# Patient Record
Sex: Female | Born: 1945 | Race: White | Hispanic: No | Smoking: Never smoker
Health system: Southern US, Community
[De-identification: ages and names within clinical notes are randomized; demographics above are authoritative.]

## PROBLEM LIST (undated history)

## (undated) DIAGNOSIS — T7840XA Allergy, unspecified, initial encounter: Secondary | ICD-10-CM

## (undated) DIAGNOSIS — F419 Anxiety disorder, unspecified: Secondary | ICD-10-CM

## (undated) DIAGNOSIS — Z9889 Other specified postprocedural states: Secondary | ICD-10-CM

## (undated) DIAGNOSIS — D649 Anemia, unspecified: Secondary | ICD-10-CM

## (undated) DIAGNOSIS — H269 Unspecified cataract: Secondary | ICD-10-CM

## (undated) DIAGNOSIS — M810 Age-related osteoporosis without current pathological fracture: Secondary | ICD-10-CM

## (undated) DIAGNOSIS — L500 Allergic urticaria: Secondary | ICD-10-CM

## (undated) DIAGNOSIS — K219 Gastro-esophageal reflux disease without esophagitis: Secondary | ICD-10-CM

## (undated) DIAGNOSIS — Z5189 Encounter for other specified aftercare: Secondary | ICD-10-CM

## (undated) DIAGNOSIS — M8580 Other specified disorders of bone density and structure, unspecified site: Secondary | ICD-10-CM

## (undated) DIAGNOSIS — R112 Nausea with vomiting, unspecified: Secondary | ICD-10-CM

## (undated) DIAGNOSIS — T8859XA Other complications of anesthesia, initial encounter: Secondary | ICD-10-CM

## (undated) DIAGNOSIS — M1990 Unspecified osteoarthritis, unspecified site: Secondary | ICD-10-CM

## (undated) DIAGNOSIS — E785 Hyperlipidemia, unspecified: Secondary | ICD-10-CM

## (undated) DIAGNOSIS — I10 Essential (primary) hypertension: Secondary | ICD-10-CM

## (undated) HISTORY — DX: Essential (primary) hypertension: I10

## (undated) HISTORY — PX: CARDIOVASCULAR STRESS TEST: SHX262

## (undated) HISTORY — DX: Unspecified osteoarthritis, unspecified site: M19.90

## (undated) HISTORY — DX: Gastro-esophageal reflux disease without esophagitis: K21.9

## (undated) HISTORY — DX: Hyperlipidemia, unspecified: E78.5

## (undated) HISTORY — DX: Other specified disorders of bone density and structure, unspecified site: M85.80

## (undated) HISTORY — DX: Anemia, unspecified: D64.9

## (undated) HISTORY — PX: TRANSTHORACIC ECHOCARDIOGRAM: SHX275

## (undated) HISTORY — DX: Anxiety disorder, unspecified: F41.9

## (undated) HISTORY — PX: CARDIAC CATHETERIZATION: SHX172

## (undated) HISTORY — DX: Allergy, unspecified, initial encounter: T78.40XA

## (undated) HISTORY — DX: Unspecified cataract: H26.9

## (undated) HISTORY — DX: Age-related osteoporosis without current pathological fracture: M81.0

## (undated) HISTORY — PX: COLONOSCOPY: SHX174

## (undated) HISTORY — PX: SINOSCOPY: SHX187

## (undated) HISTORY — DX: Encounter for other specified aftercare: Z51.89

---
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
## 2004-11-13 ENCOUNTER — Ambulatory Visit: Payer: Self-pay | Admitting: Gastroenterology

## 2004-11-25 ENCOUNTER — Ambulatory Visit: Payer: Self-pay | Admitting: Gastroenterology

## 2008-03-23 HISTORY — PX: TRANSTHORACIC ECHOCARDIOGRAM: SHX275

## 2008-12-05 ENCOUNTER — Encounter: Admission: RE | Admit: 2008-12-05 | Discharge: 2008-12-05 | Payer: Self-pay | Admitting: Cardiology

## 2008-12-11 ENCOUNTER — Ambulatory Visit (HOSPITAL_COMMUNITY): Admission: RE | Admit: 2008-12-11 | Discharge: 2008-12-11 | Payer: Self-pay | Admitting: Cardiology

## 2008-12-11 HISTORY — PX: CARDIAC CATHETERIZATION: SHX172

## 2009-10-15 ENCOUNTER — Encounter: Payer: Self-pay | Admitting: Gastroenterology

## 2010-01-15 ENCOUNTER — Encounter (INDEPENDENT_AMBULATORY_CARE_PROVIDER_SITE_OTHER): Payer: Self-pay | Admitting: *Deleted

## 2010-02-04 ENCOUNTER — Encounter (INDEPENDENT_AMBULATORY_CARE_PROVIDER_SITE_OTHER): Payer: Self-pay | Admitting: *Deleted

## 2010-02-06 ENCOUNTER — Ambulatory Visit
Admission: RE | Admit: 2010-02-06 | Discharge: 2010-02-06 | Payer: Self-pay | Source: Home / Self Care | Attending: Gastroenterology | Admitting: Gastroenterology

## 2010-02-13 NOTE — Miscellaneous (Signed)
Summary: [facility]  Clinical Lists Changes  Medications: Added new medication of MOVIPREP 100 GM  SOLR (PEG-KCL-NACL-NASULF-NA ASC-C) As per prep instructions. - Signed Rx of MOVIPREP 100 GM  SOLR (PEG-KCL-NACL-NASULF-NA ASC-C) As per prep instructions.;  #1 x 0;  Signed;  Entered by: Ezra Sites RN;  Authorized by: Louis Meckel MD;  Method used: [id number] [address] [place], [place], [place]  [address], Ph: [phone] or [phone], Fax: [phone] Allergies: Added new allergy or adverse reaction of ERYTHROMYCIN Added new allergy or adverse reaction of * CT DYE Observations: Added new observation of NKA: F (02/06/2010 8:57)    Prescriptions: MOVIPREP 100 GM  SOLR (PEG-KCL-NACL-NASULF-NA ASC-C) As per prep instructions.  #1 x 0   Entered by:   Ezra Sites RN   Authorized by:   Louis Meckel MD   Signed by:   Ezra Sites RN on 02/06/2010   Method used:   [facility]  [address] [phone][facility] (retail)       [address] [place], [place]  [address]       Ph: [phone] or [phone]       Fax: [phone]   RxID:   [id number]

## 2010-02-13 NOTE — Letter (Signed)
Summary: Pre Visit Letter Revised  [facility]  [address] [place], [place] [address]   Phone: [phone]  Fax: [phone]        01/15/2010 MRN: [id number] Karen Fox [address] [place], [place]  [address]             Procedure Date:  February 27, 2010   recall col-Dr Nedra Hai to the Gastroenterology Division at [facility]    You are scheduled to see a nurse for your pre-procedure visit on February 06, 2010 at 9:00am on the 3rd floor at [facility], [address]  We ask that you try to arrive at our office 15 minutes prior to your appointment time to allow for check-in.  Please take a minute to review the attached form.  If you answer "Yes" to one or more of the questions on the first page, we ask that you call the person listed at your earliest opportunity.  If you answer "No" to all of the questions, please complete the rest of the form and bring it to your appointment.    Your nurse visit will consist of discussing your medical and surgical history, your immediate family medical history, and your medications.   If you are unable to list all of your medications on the form, please bring the medication bottles to your appointment and we will list them.  We will need to be aware of both prescribed and over the counter drugs.  We will need to know exact dosage information as well.    Please be prepared to read and sign documents such as consent forms, a financial agreement, and acknowledgement forms.  If necessary, and with your consent, a friend or relative is welcome to sit-in on the nurse visit with you.  Please bring your insurance card so that we may make a copy of it.  If your insurance requires a referral to see a specialist, please bring your referral form from your primary care physician.  No co-pay is required for this nurse visit.     If you cannot keep your appointment, please call [phone] to cancel or reschedule prior  to your appointment date.  This allows [place] the opportunity to schedule an appointment for another patient in need of care.    Thank you for choosing  Gastroenterology for your medical needs.  We appreciate the opportunity to care for you.  Please visit [place] at our website  to learn more about our practice.  Sincerely, The Gastroenterology Division

## 2010-02-13 NOTE — Letter (Signed)
Summary: [facility] Instructions  [facility]  [address] [place], [place] [address]   Phone: [phone]  Fax: [phone]       Karen Fox    April 01, 1945    MRN: [id number]        Procedure Day /Date:  Thursday 03/06/2010     Arrival Time: 8:00 am     Procedure Time: 9:00 am     Location of Procedure:                    _x _  [facility] (4th Floor)                        PREPARATION FOR COLONOSCOPY WITH MOVIPREP   Starting 5 days prior to your procedure Saturday 2/18 do not eat nuts, seeds, popcorn, corn, beans, peas,  salads, or any raw vegetables.  Do not take any fiber supplements (e.g. Metamucil, Citrucel, and Benefiber).  THE DAY BEFORE YOUR PROCEDURE         DATE: Wednesday 2/22  1.  Drink clear liquids the entire day-NO SOLID FOOD  2.  Do not drink anything colored red or purple.  Avoid juices with pulp.  No orange juice.  3.  Drink at least 64 oz. (8 glasses) of fluid/clear liquids during the day to prevent dehydration and help the prep work efficiently.  CLEAR LIQUIDS INCLUDE: Water Jello Ice Popsicles Tea (sugar ok, no milk/cream) Powdered fruit flavored drinks Coffee (sugar ok, no milk/cream) Gatorade Juice: apple, white grape, white cranberry  Lemonade Clear bullion, consomm, broth Carbonated beverages (any kind) Strained chicken noodle soup Hard Candy                             4.  In the morning, mix first dose of MoviPrep solution:    Empty 1 Pouch A and 1 Pouch B into the disposable container    Add lukewarm drinking water to the top line of the container. Mix to dissolve    Refrigerate (mixed solution should be used within 24 hrs)  5.  Begin drinking the prep at 5:00 p.m. The MoviPrep container is divided by 4 marks.   Every 15 minutes drink the solution down to the next mark (approximately 8 oz) until the full liter is complete.   6.  Follow completed prep with 16 oz of clear liquid of your choice  (Nothing red or purple).  Continue to drink clear liquids until bedtime.  7.  Before going to bed, mix second dose of MoviPrep solution:    Empty 1 Pouch A and 1 Pouch B into the disposable container    Add lukewarm drinking water to the top line of the container. Mix to dissolve    Refrigerate  THE DAY OF YOUR PROCEDURE      DATE: Thursday 2/23  Beginning at 4:00 a.m. (5 hours before procedure):         1. Every 15 minutes, drink the solution down to the next mark (approx 8 oz) until the full liter is complete.  2. Follow completed prep with 16 oz. of clear liquid of your choice.    3. You may drink clear liquids until 7:00 am (2 HOURS BEFORE PROCEDURE).   MEDICATION INSTRUCTIONS  Unless otherwise instructed, you should take regular prescription medications with a small sip of water   as early as possible the morning of your  procedure.          OTHER INSTRUCTIONS  You will need a responsible adult at least 64 years of age to accompany you and drive you home.   This person must remain in the waiting room during your procedure.  Wear loose fitting clothing that is easily removed.  Leave jewelry and other valuables at home.  However, you may wish to bring a book to read or  an iPod/MP3 player to listen to music as you wait for your procedure to start.  Remove all body piercing jewelry and leave at home.  Total time from sign-in until discharge is approximately 2-3 hours.  You should go home directly after your procedure and rest.  You can resume normal activities the  day after your procedure.  The day of your procedure you should not:   Drive   Make legal decisions   Operate machinery   Drink alcohol   Return to work  You will receive specific instructions about eating, activities and medications before you leave.    The above instructions have been reviewed and explained to me by   Ezra Sites RN  February 06, 2010 9:25 AM     I fully understand and  can verbalize these instructions _____________________________ Date _________

## 2010-02-13 NOTE — Letter (Signed)
Summary: Colonoscopy Letter  [facility]  [address] [place], [place] [address]   Phone: [phone]  Fax: [phone]      October 15, 2009 MRN: [id number]   [facility] [address] [place], [place]  [address]   Dear Ms. [facility],   According to your medical record, it is time for you to schedule a Colonoscopy. The American Cancer Society recommends this procedure as a method to detect early colon cancer. Patients with a family history of colon cancer, or a personal history of colon polyps or inflammatory bowel disease are at increased risk.  This letter has been generated based on the recommendations made at the time of your procedure. If you feel that in your particular situation this may no longer apply, please contact our office.  Please call our office at [phone] to schedule this appointment or to update your records at your earliest convenience.  Thank you for cooperating with [place] to provide you with the very best care possible.   Sincerely,  Barbette Hair. Arlyce Dice, M.D.  [facility] Gastroenterology Division [phone]

## 2010-03-06 ENCOUNTER — Other Ambulatory Visit (AMBULATORY_SURGERY_CENTER): Payer: PRIVATE HEALTH INSURANCE | Admitting: Gastroenterology

## 2010-03-06 ENCOUNTER — Encounter: Payer: Self-pay | Admitting: Gastroenterology

## 2010-03-06 DIAGNOSIS — K5730 Diverticulosis of large intestine without perforation or abscess without bleeding: Secondary | ICD-10-CM

## 2010-03-06 DIAGNOSIS — Z1211 Encounter for screening for malignant neoplasm of colon: Secondary | ICD-10-CM

## 2010-03-06 DIAGNOSIS — Z800 Family history of malignant neoplasm of digestive organs: Secondary | ICD-10-CM

## 2010-03-11 NOTE — Procedures (Signed)
Summary: Colonoscopy  Patient: Karen Fox Note: All result statuses are Final unless otherwise noted.  Tests: (1) Colonoscopy (COL)   COL Colonoscopy           DONE     [facility]     [address]     [place], [place]  [address]           COLONOSCOPY PROCEDURE REPORT           PATIENT:  Karen Fox, Karen Fox  MR#:  [id number]     BIRTHDATE:  23-May-1945, 64 yrs. old  GENDER:  female           ENDOSCOPIST:  Barbette Hair. Arlyce Dice, MD     Referred by:  Marjory Lies, M.D.           PROCEDURE DATE:  03/06/2010     PROCEDURE:  Diagnostic Colonoscopy     ASA CLASS:  Class II     INDICATIONS:  1) Elevated Risk Screening  2) family history of     colon cancer Father in 42's           MEDICATIONS:   Fentanyl 75 mcg IV, Versed 7 mg IV           DESCRIPTION OF PROCEDURE:   After the risks benefits and     alternatives of the procedure were thoroughly explained, informed     consent was obtained.  Digital rectal exam was performed and     revealed no abnormalities.   The [id number] endoscope was     introduced through the anus and advanced to the cecum, which was     identified by both the appendix and ileocecal valve, without     limitations.  The quality of the prep was excellent, using     MoviPrep.  The instrument was then slowly withdrawn as the colon     was fully examined.     <<PROCEDUREIMAGES>>           FINDINGS:  Moderate diverticulosis was found in the sigmoid colon     (see image1).  Scattered diverticula were found (see image6).     descending to ascending colon  This was otherwise a normal     examination of the colon (see image2, image3, image4, image7,     image9, and image10).   Retroflexed views in the rectum revealed     no abnormalities.    The time to cecum =  5.0  minutes. The scope     was then withdrawn (time =  6.0  min) from the patient and the     procedure completed.           COMPLICATIONS:  None           ENDOSCOPIC IMPRESSION:     1)  Moderate diverticulosis in the sigmoid colon     2) Diverticula, scattered     3) Otherwise normal examination     RECOMMENDATIONS:     1) Given your significant family history of colon cancer, you     should have a repeat colonoscopy in 5 years           REPEAT EXAM:   5 year(s) Colonoscopy           ______________________________     Barbette Hair. Arlyce Dice, MD           CC:           n.     eSIGNED:  Barbette Hair. Kaplan at 03/06/2010 09:43 AM           [place], [id number]  Note: An exclamation mark (!) indicates a result that was not dispersed into the flowsheet. Document Creation Date: 03/06/2010 9:44 AM _______________________________________________________________________  (1) Order result status: Final Collection or observation date-time: 03/06/2010 09:39 Requested date-time:  Receipt date-time:  Reported date-time:  Referring Physician:   Ordering Physician: Melvia Heaps [id number]) Specimen Source:  Source: Launa Grill Order Number: [id number] Lab site:   Appended Document: Colonoscopy    Clinical Lists Changes  Observations: Added new observation of COLONNXTDUE: 02/2015 (03/06/2010 16:20)

## 2011-12-31 ENCOUNTER — Other Ambulatory Visit (HOSPITAL_COMMUNITY): Payer: Self-pay | Admitting: Cardiovascular Disease

## 2011-12-31 DIAGNOSIS — R079 Chest pain, unspecified: Secondary | ICD-10-CM

## 2011-12-31 DIAGNOSIS — R0602 Shortness of breath: Secondary | ICD-10-CM

## 2012-01-12 ENCOUNTER — Encounter (HOSPITAL_COMMUNITY): Payer: PRIVATE HEALTH INSURANCE

## 2012-01-26 ENCOUNTER — Ambulatory Visit (HOSPITAL_COMMUNITY)
Admission: RE | Admit: 2012-01-26 | Discharge: 2012-01-26 | Disposition: A | Discharge status: Home / Self Care | Payer: Medicare Other | Source: Ambulatory Visit | Attending: Cardiovascular Disease | Admitting: Cardiovascular Disease

## 2012-01-26 DIAGNOSIS — R079 Chest pain, unspecified: Secondary | ICD-10-CM

## 2012-01-26 DIAGNOSIS — R0609 Other forms of dyspnea: Secondary | ICD-10-CM | POA: Insufficient documentation

## 2012-01-26 DIAGNOSIS — I2510 Atherosclerotic heart disease of native coronary artery without angina pectoris: Secondary | ICD-10-CM | POA: Insufficient documentation

## 2012-01-26 DIAGNOSIS — R0989 Other specified symptoms and signs involving the circulatory and respiratory systems: Secondary | ICD-10-CM | POA: Insufficient documentation

## 2012-01-26 DIAGNOSIS — R0602 Shortness of breath: Secondary | ICD-10-CM

## 2012-01-26 DIAGNOSIS — I10 Essential (primary) hypertension: Secondary | ICD-10-CM | POA: Insufficient documentation

## 2012-01-26 HISTORY — PX: CARDIOVASCULAR STRESS TEST: SHX262

## 2012-01-26 MED ORDER — TECHNETIUM TC 99M SESTAMIBI GENERIC - CARDIOLITE
10.1000 | Freq: Once | INTRAVENOUS | Status: AC | PRN
  Administered 2012-01-26: 10.1 via INTRAVENOUS

## 2012-01-26 MED ORDER — TECHNETIUM TC 99M SESTAMIBI GENERIC - CARDIOLITE
30.6000 | Freq: Once | INTRAVENOUS | Status: AC | PRN
  Administered 2012-01-26: 31 via INTRAVENOUS

## 2012-01-26 NOTE — Procedures (Addendum)
[facility] [address] [place] [address] [phone]  Cardiology Nuclear Med Study  Karen Fox is a 67 y.o. female     MRN : [id number]     DOB: [birth year]  Procedure Date: 01/26/2012  Nuclear Med Background Indication for Stress Test:  Evaluation for Ischemia History:  non critical CAD Cardiac Risk Factors: Family History - CAD, Hypertension and Lipids  Symptoms:  Chest Pain and DOE   Nuclear Pre-Procedure Caffeine/Decaff Intake:  7:00pm NPO After: 5:00am   IV Site: R Antecubital  IV 0.9% NS with Angio Cath:  22g  Chest Size (in):  n/a IV Started by: Koren Shiver, CNMT  Height: 5\' 5"  (1.651 m)  Cup Size: C  BMI:  Body mass index is 25.79 kg/(m^2). Weight:  155 lb (70.308 kg)   Tech Comments:  n/a    Nuclear Med Study 1 or 2 day study: 1 day  Stress Test Type:  Stress  Order Authorizing Provider:  Nanetta Batty, MD   Resting Radionuclide: Technetium 42m Sestamibi  Resting Radionuclide Dose: 10.1 mCi   Stress Radionuclide:  Technetium 43m Sestamibi  Stress Radionuclide Dose: 30.6 mCi           Stress Protocol Rest HR: 82 Stress HR: 141  Rest BP: 134/83 Stress BP: 188/69  Exercise Time (min): 9 METS: 10.40          Dose of Adenosine (mg):  n/a Dose of Lexiscan: n/a mg  Dose of Atropine (mg): n/a Dose of Dobutamine: n/a mcg/kg/min (at max HR)  Stress Test Technologist: Ernestene Mention, CCT Nuclear Technologist: Gonzella Lex, CNMT   Rest Procedure:  Myocardial perfusion imaging was performed at rest 45 minutes following the intravenous administration of Technetium 59m Sestamibi. Stress Procedure:  The patient performed treadmill exercise using a Bruce  Protocol for 9 minutes. The patient stopped due to shortness of breath, fatigue and some chest discomfort. Chest discomfort did resolve during the first two minutes of recovery.  There were no significant ST-T wave changes.  Technetium 43m Sestamibi was  injected at peak exercise and myocardial perfusion imaging was performed after a brief delay.  Transient Ischemic Dilatation (Normal <1.22):  0.95 Lung/Heart Ratio (Normal <0.45):  0.25 QGS EDV:  44 ml QGS ESV:  10 ml LV Ejection Fraction: 78%     Rest ECG: NSR - Normal EKG  Stress ECG: No significant change from baseline ECG  QPS Raw Data Images:  Normal; no motion artifact; normal heart/lung ratio. Stress Images:  There is a small area of mild reduction in tracer uptake in the anteroapical distribution. Rest Images:  There is partial resolution of the anteroapical defect. Subtraction (SDS):  There is a partially revesible small and mild anteroapical defect. LV Wall Motion:  NL LV Function; NL Wall Motion  Impression Exercise Capacity:  Good exercise capacity. BP Response:  Normal blood pressure response. Clinical Symptoms:  Typical chest pain. ECG Impression:  No significant ST segment change suggestive of ischemia. Comparison with Prior Nuclear Study: Direct comparison with the previous year's study shows a slightly more prominent anteroapical abnormality on the current study. The previous study was interpreted as normal.  Overall Impression:  Low risk stress nuclear study. There is a small area of mild ischemia in the distal LAD artery distribution.     Thurmon Fair, MD  01/26/2012 12:26 PM

## 2012-05-13 ENCOUNTER — Encounter: Payer: Self-pay | Admitting: Cardiovascular Disease

## 2013-04-12 ENCOUNTER — Ambulatory Visit: Payer: PRIVATE HEALTH INSURANCE | Admitting: Cardiovascular Disease

## 2013-04-14 ENCOUNTER — Encounter: Payer: Self-pay | Admitting: Cardiovascular Disease

## 2013-04-14 ENCOUNTER — Ambulatory Visit (INDEPENDENT_AMBULATORY_CARE_PROVIDER_SITE_OTHER): Payer: Medicare Other | Admitting: Cardiovascular Disease

## 2013-04-14 VITALS — BP 136/82 | HR 79 | Ht 65.25 in | Wt 154.8 lb

## 2013-04-14 DIAGNOSIS — E785 Hyperlipidemia, unspecified: Secondary | ICD-10-CM

## 2013-04-14 DIAGNOSIS — I10 Essential (primary) hypertension: Secondary | ICD-10-CM

## 2013-04-14 NOTE — Progress Notes (Signed)
04/14/2013 [place]   05-17-1945  [id number]  Primary Physician Stephens Shire, MD Primary Cardiologist: Lorretta Harp MD Renae Gloss   HPI:  Mr. Dardis returns today for one-year followup. I saw her after a Myoview stress test showed subtle inferior ischemia, done because of chest pain which has since resolved. Her risk factors include treated hyperlipidemia and family history. Her most recent LDL was 100 with mild hypertriglyceridemia. She does complain of reflux-type symptoms as well, which have improved over time. Her most recent lipid profile performed by her primary care physician in October of last year for a glucose of 213, LDL 127, HDL of 46 and triglyceride level of 202.    Current Outpatient Prescriptions  Medication Sig Dispense Refill  . ALPRAZolam (XANAX) 0.5 MG tablet Take 0.5 mg by mouth at bedtime as needed for anxiety.      Marland Kitchen aspirin EC 81 MG tablet Take 81 mg by mouth daily.      . Butalbital-APAP-Caffeine (ZEBUTAL) 50-325-40 MG per capsule Take 1 capsule by mouth every 4 (four) hours as needed for pain.      . Cholecalciferol (VITAMIN D3) 2000 UNITS capsule Take 2,000 Units by mouth daily.      . citalopram (CELEXA) 20 MG tablet Take 1 tablet by mouth daily.      . fenofibrate 160 MG tablet Take 160 mg by mouth daily.      . fexofenadine (ALLEGRA) 180 MG tablet Take 180 mg by mouth daily.      . fluticasone (FLONASE) 50 MCG/ACT nasal spray Place 1-2 sprays into both nostrils daily.      . fosinopril (MONOPRIL) 10 MG tablet Take 10 mg by mouth daily.      . Omega 3 1000 MG CAPS Take 1 capsule by mouth daily.      . polycarbophil (FIBERCON) 625 MG tablet Take 312.5 mg by mouth daily.      . vitamin E 400 UNIT capsule Take 400 Units by mouth daily.       No current facility-administered medications for this visit.    Allergies  Allergen Reactions  . Contrast Media [Iodinated Diagnostic Agents] Hives  . Erythromycin     REACTION: rash     History   Social History  . Marital Status: Married    Spouse Name: N/A    Number of Children: N/A  . Years of Education: N/A   Occupational History  . Not on file.   Social History Main Topics  . Smoking status: Never Smoker   . Smokeless tobacco: Never Used  . Alcohol Use: No  . Drug Use: No  . Sexual Activity: Not on file   Other Topics Concern  . Not on file   Social History Narrative  . No narrative on file     Review of Systems: General: negative for chills, fever, night sweats or weight changes.  Cardiovascular: negative for chest pain, dyspnea on exertion, edema, orthopnea, palpitations, paroxysmal nocturnal dyspnea or shortness of breath Dermatological: negative for rash Respiratory: negative for cough or wheezing Urologic: negative for hematuria Abdominal: negative for nausea, vomiting, diarrhea, bright red blood per rectum, melena, or hematemesis Neurologic: negative for visual changes, syncope, or dizziness All other systems reviewed and are otherwise negative except as noted above.    Blood pressure 136/82, pulse 79, height 5' 5.25" (1.657 m), weight 154 lb 12.8 oz (70.217 kg).  General appearance: alert and no distress Neck: no adenopathy, no carotid bruit, no  JVD, supple, symmetrical, trachea midline and thyroid not enlarged, symmetric, no tenderness/mass/nodules Lungs: clear to auscultation bilaterally Heart: regular rate and rhythm, S1, S2 normal, no murmur, click, rub or gallop Extremities: extremities normal, atraumatic, no cyanosis or edema  EKG normal sinus rhythm at 79 without ST or T wave changes  ASSESSMENT AND PLAN:   Essential hypertension Under good control on current medications  Hyperlipidemia On fenofibrate with recent lipid profile performed by her primary care physician on 11/08/12 with a total cholesterol of 213, LDL 127, HDL 46 and triglyceride level of 202. She does admit to dietary indiscretion.      Lorretta Harp  MD FACP,FACC,FAHA, [facility] 04/14/2013 11:00 AM

## 2013-04-14 NOTE — Patient Instructions (Signed)
Your physician recommends that you schedule a follow-up appointment in: ONE YEAR with Dr. Berry.  

## 2013-04-14 NOTE — Assessment & Plan Note (Addendum)
On fenofibrate with recent lipid profile performed by her primary care physician on 11/08/12 with a total cholesterol of 213, LDL 127, HDL 46 and triglyceride level of 202. She does admit to dietary indiscretion.

## 2013-04-14 NOTE — Assessment & Plan Note (Signed)
Under good control on current medications 

## 2015-03-08 ENCOUNTER — Encounter: Payer: Self-pay | Admitting: Gastroenterology

## 2015-05-07 ENCOUNTER — Encounter: Payer: Self-pay | Admitting: Gastroenterology

## 2015-06-06 ENCOUNTER — Ambulatory Visit (AMBULATORY_SURGERY_CENTER): Payer: Self-pay | Admitting: *Deleted

## 2015-06-06 ENCOUNTER — Telehealth: Payer: Self-pay | Admitting: *Deleted

## 2015-06-06 VITALS — Ht 65.0 in | Wt 156.0 lb

## 2015-06-06 DIAGNOSIS — Z800 Family history of malignant neoplasm of digestive organs: Secondary | ICD-10-CM

## 2015-06-06 MED ORDER — NA SULFATE-K SULFATE-MG SULF 17.5-3.13-1.6 GM/177ML PO SOLN: 1.0000 | Freq: Once | ORAL | Status: DC

## 2015-06-06 NOTE — Telephone Encounter (Signed)
Ok to schedule EGD. Thanks

## 2015-06-06 NOTE — Telephone Encounter (Signed)
Dr Silverio Decamp, This pt is a previous pt of Deatra Ina, she has [facility] in [place], last colon 03-06-10 with tics only. In [place] today she is asking to add an EGD to her colon that is scheduled 06-27-15. She has had some mid sternum chest pain , she has seen cardiology and has had work ups x 2 that were both normal , had cardiac cath as well that was normal.  She has some reflux but not severe, she has pain mid sternum, when she walks to exercise .  She has this complaint for 2-3 years and she states she will have pain occasionally for days and then it goes away and comes back.  She has tried Nexium and ranitidine with no help. She has not seen GI for this complaint and is asking about adding the egd to see if its reflux or HH or something.  You have no OV open to schedule her with you so wanted to ask about adding.  She denies dysphagia but states she will occasionally get strangled with both liquids and solids but seems a bit better recently. Please advise  thanks for your time, [name]

## 2015-06-06 NOTE — Telephone Encounter (Signed)
Changed pt to a 2 pm double 06-27-2015 per pt request to keep same day. Instructed pt on time changes for new time the same day over the phone and she changed on her prep instructions.   She was instructed on the light breakfast by 9 am 6-14 and then just cleras, 1st suprep 6 pm Wednesday and then 2nd suprep 9 am 6-15 and no liquids after 11 am, meds all in by 11am. Instructed pt to call with questions Lelan Pons PV

## 2015-06-06 NOTE — Progress Notes (Signed)
No egg or soy allergy known to patient  No issues with past sedation with any surgeries  or procedures, no intubation problems  No diet pills per patient No home 02 use per patient  No blood thinners per patient  Pt denies issues with constipation  Te to Nandigam -pt wishes to add egd to colon. Lelan Pons

## 2015-06-27 ENCOUNTER — Encounter: Payer: Medicare Other | Admitting: Gastroenterology

## 2015-06-27 ENCOUNTER — Encounter: Payer: Self-pay | Admitting: Gastroenterology

## 2015-06-27 ENCOUNTER — Ambulatory Visit (AMBULATORY_SURGERY_CENTER): Payer: Medicare Other | Admitting: Gastroenterology

## 2015-06-27 VITALS — BP 136/64 | HR 66 | Temp 98.7°F | Resp 10 | Ht 65.0 in | Wt 156.0 lb

## 2015-06-27 DIAGNOSIS — K219 Gastro-esophageal reflux disease without esophagitis: Secondary | ICD-10-CM | POA: Diagnosis not present

## 2015-06-27 DIAGNOSIS — R079 Chest pain, unspecified: Secondary | ICD-10-CM

## 2015-06-27 DIAGNOSIS — Z800 Family history of malignant neoplasm of digestive organs: Secondary | ICD-10-CM | POA: Diagnosis not present

## 2015-06-27 DIAGNOSIS — D122 Benign neoplasm of ascending colon: Secondary | ICD-10-CM

## 2015-06-27 DIAGNOSIS — Z1211 Encounter for screening for malignant neoplasm of colon: Secondary | ICD-10-CM

## 2015-06-27 DIAGNOSIS — K635 Polyp of colon: Secondary | ICD-10-CM | POA: Diagnosis not present

## 2015-06-27 DIAGNOSIS — R1310 Dysphagia, unspecified: Secondary | ICD-10-CM | POA: Diagnosis not present

## 2015-06-27 MED ORDER — SODIUM CHLORIDE 0.9 % IV SOLN: 500.0000 mL | INTRAVENOUS | Status: DC

## 2015-06-27 NOTE — Patient Instructions (Signed)
Impression/recommendations:  Endoscopy: Normal   Colonoscopy: Polyp (handout given) Diverticulosis (handout given) High Fiber Diet (handout given) Hemorrhoids (handout given)  YOU HAD AN ENDOSCOPIC PROCEDURE TODAY AT [facility]:   Refer to the procedure report that was given to you for any specific questions about what was found during the examination.  If the procedure report does not answer your questions, please call your gastroenterologist to clarify.  If you requested that your care partner not be given the details of your procedure findings, then the procedure report has been included in a sealed envelope for you to review at your convenience later.  YOU SHOULD EXPECT: Some feelings of bloating in the abdomen. Passage of more gas than usual.  Walking can help get rid of the air that was put into your GI tract during the procedure and reduce the bloating. If you had a lower endoscopy (such as a colonoscopy or flexible sigmoidoscopy) you may notice spotting of blood in your stool or on the toilet paper. If you underwent a bowel prep for your procedure, you may not have a normal bowel movement for a few days.  Please Note:  You might notice some irritation and congestion in your nose or some drainage.  This is from the oxygen used during your procedure.  There is no need for concern and it should clear up in a day or so.  SYMPTOMS TO REPORT IMMEDIATELY:   Following lower endoscopy (colonoscopy or flexible sigmoidoscopy):  Excessive amounts of blood in the stool  Significant tenderness or worsening of abdominal pains  Swelling of the abdomen that is new, acute  Fever of 100F or higher   Following upper endoscopy (EGD)  Vomiting of blood or coffee ground material  New chest pain or pain under the shoulder blades  Painful or persistently difficult swallowing  New shortness of breath  Fever of 100F or higher  Black, tarry-looking stools  For urgent or emergent  issues, a gastroenterologist can be reached at any hour by calling [phone].   DIET: Your first meal following the procedure should be a small meal and then it is ok to progress to your normal diet. Heavy or fried foods are harder to digest and may make you feel nauseous or bloated.  Likewise, meals heavy in dairy and vegetables can increase bloating.  Drink plenty of fluids but you should avoid alcoholic beverages for 24 hours.  ACTIVITY:  You should plan to take it easy for the rest of today and you should NOT DRIVE or use heavy machinery until tomorrow (because of the sedation medicines used during the test).    FOLLOW UP: Our staff will call the number listed on your records the next business day following your procedure to check on you and address any questions or concerns that you may have regarding the information given to you following your procedure. If we do not reach you, we will leave a message.  However, if you are feeling well and you are not experiencing any problems, there is no need to return our call.  We will assume that you have returned to your regular daily activities without incident.  If any biopsies were taken you will be contacted by phone or by letter within the next 1-3 weeks.  Please call us at [phone] if you have not heard about the biopsies in 3 weeks.    SIGNATURES/CONFIDENTIALITY: You and/or your care partner have signed paperwork which will be entered into your electronic  medical record.  These signatures attest to the fact that that the information above on your After Visit Summary has been reviewed and is understood.  Full responsibility of the confidentiality of this discharge information lies with you and/or your care-partner.

## 2015-06-27 NOTE — Op Note (Signed)
[facility] Patient Name: Karen Fox Procedure Date: 06/27/2015 1:53 PM MRN: [id number] Endoscopist: Mauri Pole , MD Age: 70 Referring MD:  Date of Birth: March 06, 1945 Gender: Female Procedure:                Colonoscopy Indications:              Screening in patient at increased risk: Family                            history of 1st-degree relative with colorectal                            cancer Medicines:                Monitored Anesthesia Care Procedure:                Pre-Anesthesia Assessment:                           - Prior to the procedure, a History and Physical                            was performed, and patient medications and                            allergies were reviewed. The patient's tolerance of                            previous anesthesia was also reviewed. The risks                            and benefits of the procedure and the sedation                            options and risks were discussed with the patient.                            All questions were answered, and informed consent                            was obtained. Prior Anticoagulants: The patient has                            taken no previous anticoagulant or antiplatelet                            agents. ASA Grade Assessment: II - A patient with                            mild systemic disease. After reviewing the risks                            and benefits, the patient was deemed in  satisfactory condition to undergo the procedure.                           After obtaining informed consent, the colonoscope                            was passed under direct vision. Throughout the                            procedure, the patient's blood pressure, pulse, and                            oxygen saturations were monitored continuously. The                            Model CF-HQ190L [id number]) scope was introduced     through the anus and advanced to the the cecum,                            identified by appendiceal orifice and ileocecal                            valve. The colonoscopy was performed without                            difficulty. The patient tolerated the procedure                            well. The quality of the bowel preparation was                            good. The terminal ileum, ileocecal valve,                            appendiceal orifice, and rectum were photographed. Scope In: 2:12:09 PM Scope Out: 2:27:25 PM Scope Withdrawal Time: 0 hours 8 minutes 44 seconds  Total Procedure Duration: 0 hours 15 minutes 16 seconds  Findings:                 The perianal and digital rectal examinations were                            normal.                           A 12 mm polyp was found in the ascending colon. The                            polyp was sessile. The polyp was removed with a hot                            snare. Resection and retrieval were complete.                           Multiple small  and large-mouthed diverticula were                            found in the sigmoid colon and descending colon.                           Non-bleeding internal hemorrhoids were found during                            retroflexion. The hemorrhoids were medium-sized. Complications:            No immediate complications. Estimated Blood Loss:     Estimated blood loss was minimal. Impression:               - One 12 mm polyp in the ascending colon, removed                            with a hot snare. Resected and retrieved.                           - Diverticulosis in the sigmoid colon and in the                            descending colon.                           - Non-bleeding internal hemorrhoids. Recommendation:           - Patient has a contact number available for                            emergencies. The signs and symptoms of potential                            delayed  complications were discussed with the                            patient. Return to normal activities tomorrow.                            Written discharge instructions were provided to the                            patient.                           - Resume previous diet.                           - Continue present medications.                           - Await pathology results.                           - Repeat colonoscopy in 3 - 5 years for  surveillance.                           - Return to GI clinic PRN. Mauri Pole, MD 06/27/2015 2:35:15 PM This report has been signed electronically.

## 2015-06-27 NOTE — Progress Notes (Signed)
Called to room to assist during endoscopic procedure.  Patient ID and intended procedure confirmed with present staff. Received instructions for my participation in the procedure from the performing physician.  

## 2015-06-27 NOTE — Op Note (Signed)
[facility] Patient Name: Karen Fox Procedure Date: 06/27/2015 1:56 PM MRN: [id number] Endoscopist: Mauri Pole , MD Age: 70 Referring MD:  Date of Birth: 04/10/1945 Gender: Female Procedure:                Upper GI endoscopy Indications:              Dysphagia, Esophageal reflux symptoms that persist                            despite appropriate therapy Medicines:                Monitored Anesthesia Care Procedure:                Pre-Anesthesia Assessment:                           - Prior to the procedure, a History and Physical                            was performed, and patient medications and                            allergies were reviewed. The patient's tolerance of                            previous anesthesia was also reviewed. The risks                            and benefits of the procedure and the sedation                            options and risks were discussed with the patient.                            All questions were answered, and informed consent                            was obtained. Prior Anticoagulants: The patient has                            taken no previous anticoagulant or antiplatelet                            agents. ASA Grade Assessment: II - A patient with                            mild systemic disease. After reviewing the risks                            and benefits, the patient was deemed in                            satisfactory condition to undergo the procedure.  After obtaining informed consent, the endoscope was                            passed under direct vision. Throughout the                            procedure, the patient's blood pressure, pulse, and                            oxygen saturations were monitored continuously. The                            Model GIF-HQ190 [id number]) scope was introduced                            through the mouth, and advanced to the  second part                            of duodenum. The upper GI endoscopy was                            accomplished without difficulty. The patient                            tolerated the procedure well. Scope In: Scope Out: Findings:                 The examined esophagus was normal. Biopsies were                            obtained from the proximal and distal esophagus                            with cold forceps for histology of suspected                            eosinophilic esophagitis.                           The stomach was normal.                           The examined duodenum was normal. Complications:            No immediate complications. Estimated Blood Loss:     Estimated blood loss was minimal. Impression:               - Normal esophagus. Biopsied.                           - Normal stomach.                           - Normal examined duodenum. Recommendation:           - Patient has a contact number available for  emergencies. The signs and symptoms of potential                            delayed complications were discussed with the                            patient. Return to normal activities tomorrow.                            Written discharge instructions were provided to the                            patient.                           - Resume previous diet.                           - Continue present medications.                           - Await pathology results.                           - No repeat upper endoscopy. Mauri Pole, MD 06/27/2015 2:33:12 PM This report has been signed electronically.

## 2015-06-27 NOTE — Progress Notes (Signed)
Report to PACU, RN, vss, BBS= Clear.  

## 2015-06-28 ENCOUNTER — Telehealth: Payer: Self-pay

## 2015-06-28 NOTE — Telephone Encounter (Signed)
  Follow up Call-  Call back number 06/27/2015  Post procedure Call Back phone  # [phone]  Permission to leave phone message Yes     Patient questions:  Do you have a fever, pain , or abdominal swelling? No. Pain Score  0 *  Have you tolerated food without any problems? Yes.    Have you been able to return to your normal activities? Yes.    Do you have any questions about your discharge instructions: Diet   No. Medications  No. Follow up visit  No.  Do you have questions or concerns about your Care? No.  Actions: * If pain score is 4 or above: No action needed, pain <4.

## 2015-07-03 ENCOUNTER — Encounter: Payer: Self-pay | Admitting: Gastroenterology

## 2016-11-09 ENCOUNTER — Other Ambulatory Visit: Payer: Self-pay | Admitting: Physician Assistant

## 2016-11-09 DIAGNOSIS — R1013 Epigastric pain: Secondary | ICD-10-CM

## 2016-11-09 DIAGNOSIS — R110 Nausea: Secondary | ICD-10-CM

## 2016-11-10 ENCOUNTER — Ambulatory Visit
Admission: RE | Admit: 2016-11-10 | Discharge: 2016-11-10 | Disposition: A | Discharge status: Home / Self Care | Payer: Medicare Other | Source: Ambulatory Visit | Attending: Physician Assistant | Admitting: Physician Assistant

## 2016-11-10 DIAGNOSIS — R1013 Epigastric pain: Secondary | ICD-10-CM

## 2016-11-10 DIAGNOSIS — R110 Nausea: Secondary | ICD-10-CM

## 2017-10-26 ENCOUNTER — Ambulatory Visit: Payer: Self-pay | Admitting: Allergy and Immunology

## 2017-11-01 ENCOUNTER — Ambulatory Visit: Payer: Self-pay | Admitting: Allergy and Immunology

## 2017-12-06 ENCOUNTER — Other Ambulatory Visit: Payer: Self-pay | Admitting: Otolaryngology

## 2017-12-06 DIAGNOSIS — R42 Dizziness and giddiness: Secondary | ICD-10-CM

## 2017-12-22 ENCOUNTER — Other Ambulatory Visit: Payer: Medicare Other

## 2017-12-22 ENCOUNTER — Ambulatory Visit
Admission: RE | Admit: 2017-12-22 | Discharge: 2017-12-22 | Disposition: A | Discharge status: Home / Self Care | Payer: Medicare Other | Source: Ambulatory Visit | Attending: Otolaryngology | Admitting: Otolaryngology

## 2017-12-22 DIAGNOSIS — R42 Dizziness and giddiness: Secondary | ICD-10-CM

## 2018-01-12 HISTORY — PX: COLONOSCOPY: SHX174

## 2018-07-01 ENCOUNTER — Encounter: Payer: Self-pay | Admitting: Gastroenterology

## 2018-07-06 ENCOUNTER — Encounter: Payer: Self-pay | Admitting: *Deleted

## 2018-07-18 ENCOUNTER — Other Ambulatory Visit: Payer: Self-pay

## 2018-07-18 ENCOUNTER — Ambulatory Visit: Payer: Medicare Other

## 2018-07-18 VITALS — Ht 65.0 in | Wt 162.0 lb

## 2018-07-18 DIAGNOSIS — Z1211 Encounter for screening for malignant neoplasm of colon: Secondary | ICD-10-CM

## 2018-07-18 MED ORDER — NA SULFATE-K SULFATE-MG SULF 17.5-3.13-1.6 GM/177ML PO SOLN: 1.0000 | Freq: Once | ORAL | 0 refills | Status: AC

## 2018-07-18 MED ORDER — NA SULFATE-K SULFATE-MG SULF 17.5-3.13-1.6 GM/177ML PO SOLN: 1.0000 | Freq: Once | ORAL | 0 refills | Status: DC

## 2018-07-18 NOTE — Progress Notes (Signed)
No egg or soy allergy known to patient  No issues with past sedation with any surgeries  or procedures, no intubation problems  No diet pills per patient No home 02 use per patient  No blood thinners per patient  Pt denies issues with constipation  No A fib or A flutter  EMMI video sent to pt's e mail  

## 2018-07-22 ENCOUNTER — Telehealth: Payer: Self-pay | Admitting: Gastroenterology

## 2018-07-22 NOTE — Telephone Encounter (Signed)

## 2018-07-25 ENCOUNTER — Other Ambulatory Visit: Payer: Self-pay

## 2018-07-25 ENCOUNTER — Ambulatory Visit (AMBULATORY_SURGERY_CENTER): Payer: Medicare Other | Admitting: Gastroenterology

## 2018-07-25 ENCOUNTER — Encounter: Payer: Medicare Other | Admitting: Gastroenterology

## 2018-07-25 ENCOUNTER — Encounter: Payer: Self-pay | Admitting: Gastroenterology

## 2018-07-25 VITALS — BP 109/64 | HR 67 | Temp 98.7°F | Resp 13 | Ht 65.0 in | Wt 162.0 lb

## 2018-07-25 DIAGNOSIS — D122 Benign neoplasm of ascending colon: Secondary | ICD-10-CM

## 2018-07-25 DIAGNOSIS — Z1211 Encounter for screening for malignant neoplasm of colon: Secondary | ICD-10-CM

## 2018-07-25 DIAGNOSIS — K635 Polyp of colon: Secondary | ICD-10-CM | POA: Diagnosis not present

## 2018-07-25 DIAGNOSIS — Z86010 Personal history of colonic polyps: Secondary | ICD-10-CM | POA: Diagnosis present

## 2018-07-25 MED ORDER — SODIUM CHLORIDE 0.9 % IV SOLN: 500.0000 mL | Freq: Once | INTRAVENOUS | Status: DC

## 2018-07-25 NOTE — Progress Notes (Signed)
Pt's states no medical or surgical changes since previsit or office visit. 

## 2018-07-25 NOTE — Progress Notes (Signed)
Vitals  Jb tEMP [place]

## 2018-07-25 NOTE — Patient Instructions (Signed)
Handouts given for polyps, diverticulosis and hemorrhoids.  No NSAIDS (Aleve, Motrin, Ibuprofen, Naproxen, Aspirin)  for 2 weeks.  You may use Tylenol if needed for pain.  You will need to have another colonoscopy in a year based on the large polyp removed.  YOU HAD AN ENDOSCOPIC PROCEDURE TODAY AT [facility]:   Refer to the procedure report that was given to you for any specific questions about what was found during the examination.  If the procedure report does not answer your questions, please call your gastroenterologist to clarify.  If you requested that your care partner not be given the details of your procedure findings, then the procedure report has been included in a sealed envelope for you to review at your convenience later.  YOU SHOULD EXPECT: Some feelings of bloating in the abdomen. Passage of more gas than usual.  Walking can help get rid of the air that was put into your GI tract during the procedure and reduce the bloating. If you had a lower endoscopy (such as a colonoscopy or flexible sigmoidoscopy) you may notice spotting of blood in your stool or on the toilet paper. If you underwent a bowel prep for your procedure, you may not have a normal bowel movement for a few days.  Please Note:  You might notice some irritation and congestion in your nose or some drainage.  This is from the oxygen used during your procedure.  There is no need for concern and it should clear up in a day or so.  SYMPTOMS TO REPORT IMMEDIATELY:   Following lower endoscopy (colonoscopy or flexible sigmoidoscopy):  Excessive amounts of blood in the stool  Significant tenderness or worsening of abdominal pains  Swelling of the abdomen that is new, acute  Fever of 100F or higher  For urgent or emergent issues, a gastroenterologist can be reached at any hour by calling [phone].   DIET:  We do recommend a small meal at first, but then you may proceed to your regular diet.  Drink  plenty of fluids but you should avoid alcoholic beverages for 24 hours.  ACTIVITY:  You should plan to take it easy for the rest of today and you should NOT DRIVE or use heavy machinery until tomorrow (because of the sedation medicines used during the test).    FOLLOW UP: Our staff will call the number listed on your records 48-72 hours following your procedure to check on you and address any questions or concerns that you may have regarding the information given to you following your procedure. If we do not reach you, we will leave a message.  We will attempt to reach you two times.  During this call, we will ask if you have developed any symptoms of COVID 19. If you develop any symptoms (ie: fever, flu-like symptoms, shortness of breath, cough etc.) before then, please call [phone].  If you test positive for Covid 19 in the 2 weeks post procedure, please call and report this information to [place].    If any biopsies were taken you will be contacted by phone or by letter within the next 1-3 weeks.  Please call us at [phone] if you have not heard about the biopsies in 3 weeks.    SIGNATURES/CONFIDENTIALITY: You and/or your care partner have signed paperwork which will be entered into your electronic medical record.  These signatures attest to the fact that that the information above on your After Visit Summary has been reviewed and  is understood.  Full responsibility of the confidentiality of this discharge information lies with you and/or your care-partner.

## 2018-07-25 NOTE — Progress Notes (Signed)
Called to room to assist during endoscopic procedure.  Patient ID and intended procedure confirmed with present staff. Received instructions for my participation in the procedure from the performing physician.  

## 2018-07-25 NOTE — Op Note (Signed)
[facility] Patient Name: Karen Fox Procedure Date: 07/25/2018 3:15 PM MRN: [id number] Endoscopist: Mauri Pole , MD Age: 73 Referring MD:  Date of Birth: [birth year] Gender: Female Account #: [id number] Procedure:                Colonoscopy Indications:              Screening in patient at increased risk: Family                            history of 1st-degree relative with colorectal                            cancer, High risk colon cancer surveillance:                            Personal history of colonic polyps, High risk colon                            cancer surveillance: Personal history of adenoma                            (10 mm or greater in size), High risk colon cancer                            surveillance: 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 The patient's tolerance of                            previous anesthesia was also reviewed. The risks                            and benefits of the procedure and the sedation                            options and risks were discussed with the patient.                            All questions were answered, and informed consent  was obtained. Prior Anticoagulants: The patient has                            taken no previous anticoagulant or antiplatelet                            agents. ASA Grade Assessment: II - A patient with                            mild systemic disease. After reviewing the risks                            and benefits, the patient was deemed in                            satisfactory condition  to undergo the procedure.                           After obtaining informed consent, the colonoscope                            was passed under direct vision. Throughout the                            procedure, the patient's blood pressure, pulse, and                            oxygen saturations were monitored continuously. The                            Colonoscope was introduced through the anus and                            advanced to the the cecum, identified by                            appendiceal orifice and ileocecal valve. The                            colonoscopy was performed without difficulty. The                            patient tolerated the procedure well. The quality                            of the bowel preparation was good. The ileocecal                            valve, appendiceal orifice, and rectum were                            photographed. Scope In: 3:19:35 PM Scope Out: 3:39:23 PM Scope Withdrawal Time: 0 hours 16 minutes 19 seconds  Total Procedure Duration: 0 hours 19 minutes  48 seconds  Findings:                 The perianal and digital rectal examinations were                            normal.                           A 20 mm polyp was found in the ascending colon. The                            polyp was granular lateral spreading. The polyp was                            removed with a piecemeal technique using a cold and                            hot snare. Resection and retrieval were complete.                           Scattered small-mouthed diverticula were found in                            the sigmoid colon, descending colon and ascending                            colon.                           Non-bleeding internal hemorrhoids were found during                            retroflexion. The hemorrhoids were small. Complications:            No immediate complications. Estimated Blood Loss:     Estimated blood loss was  minimal. Impression:               - One 20 mm polyp in the ascending colon, removed                            with a hot snare, removed piecemeal using a hot                            snare, removed with a cold snare and removed                            piecemeal using a cold snare. Resected and                            retrieved.                           - Diverticulosis in the sigmoid colon, in the  descending colon and in the ascending colon.                           - Non-bleeding internal hemorrhoids. Recommendation:           - Patient has a contact number available for                            emergencies. The signs and symptoms of potential                            delayed complications were discussed with the                            patient. Return to normal activities tomorrow.                            Written discharge instructions were provided to the                            patient.                           - Resume previous diet.                           - Continue present medications.                           - Await pathology results.                           - Repeat colonoscopy in 1 year for surveillance                            after piecemeal polypectomy. Mauri Pole, MD 07/25/2018 3:44:21 PM This report has been signed electronically.

## 2018-07-25 NOTE — Progress Notes (Signed)
A/ox3, pleased with MAC, report to RN 

## 2018-07-27 ENCOUNTER — Telehealth: Payer: Self-pay

## 2018-07-27 ENCOUNTER — Other Ambulatory Visit: Payer: Self-pay

## 2018-07-27 MED ORDER — CIPROFLOXACIN HCL 500 MG PO TABS: 500.0000 mg | ORAL_TABLET | Freq: Two times a day (BID) | ORAL | 0 refills | Status: AC

## 2018-07-27 MED ORDER — METRONIDAZOLE 500 MG PO TABS: 500.0000 mg | ORAL_TABLET | Freq: Two times a day (BID) | ORAL | 0 refills | Status: AC

## 2018-07-27 NOTE — Telephone Encounter (Signed)
  Follow up Call-  Call back number 07/25/2018  Post procedure Call Back phone  # [phone]  Permission to leave phone message Yes  Some recent data might be hidden     Patient questions:  Do you have a fever, pain , or abdominal swelling? Yes.   Pain Score  3 *  Have you tolerated food without any problems? Yes.    Have you been able to return to your normal activities? Yes.    Do you have any questions about your discharge instructions: Diet   No. Medications  No. Follow up visit  No.  Do you have questions or concerns about your Care? No.  Actions: * If pain score is 4 or above: No action needed, pain <4.  Pt reported she was having pain Right side of her abdomin rated 3/10 off and on.  This is more than likely where the ascending 20 mm polyp was removed.  She has not seen any blood, no fever.  I advised her to call us back if pain worsen or does not subside.  Pt said she would.  Maw   1. Have you developed a fever since your procedure? no  2.   Have you had an respiratory symptoms (SOB or cough) since your procedure? no  3.   Have you tested positive for COVID 19 since your procedure no  4.   Have you had any family members/close contacts diagnosed with the COVID 19 since your procedure?  no   If yes to any of these questions please route to Joylene John, RN and Alphonsa Gin, Therapist, sports.

## 2018-07-27 NOTE — Telephone Encounter (Signed)
Called patient, she has right side discomfort. No fever or hematochezia. No BM yet. 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 Will send Rx for Cipro 500mg  BID and flagyl 500mg  BID X 5 days.  Beth, please call patient on Friday to check if her symptoms are improving. Thanks

## 2018-08-02 ENCOUNTER — Encounter: Payer: Self-pay | Admitting: Gastroenterology

## 2018-08-02 NOTE — Telephone Encounter (Signed)
Patient reports she is doing well. She stopped the antibiotics after about 2 days. She developed diarrhea and shoulder pain that she attributed to the antibiotics. States she is better and thanks me for the follow up.

## 2018-08-23 ENCOUNTER — Ambulatory Visit (INDEPENDENT_AMBULATORY_CARE_PROVIDER_SITE_OTHER): Payer: Medicare Other | Admitting: Allergy and Immunology

## 2018-08-23 ENCOUNTER — Other Ambulatory Visit: Payer: Self-pay

## 2018-08-23 ENCOUNTER — Encounter: Payer: Self-pay | Admitting: Allergy and Immunology

## 2018-08-23 VITALS — BP 138/82 | HR 88 | Temp 96.0°F | Resp 18 | Ht 66.0 in | Wt 164.8 lb

## 2018-08-23 DIAGNOSIS — L500 Allergic urticaria: Secondary | ICD-10-CM | POA: Insufficient documentation

## 2018-08-23 DIAGNOSIS — H1010 Acute atopic conjunctivitis, unspecified eye: Secondary | ICD-10-CM | POA: Insufficient documentation

## 2018-08-23 DIAGNOSIS — J3089 Other allergic rhinitis: Secondary | ICD-10-CM | POA: Insufficient documentation

## 2018-08-23 DIAGNOSIS — H1013 Acute atopic conjunctivitis, bilateral: Secondary | ICD-10-CM

## 2018-08-23 DIAGNOSIS — L299 Pruritus, unspecified: Secondary | ICD-10-CM | POA: Diagnosis not present

## 2018-08-23 HISTORY — DX: Allergic urticaria: L50.0

## 2018-08-23 MED ORDER — AZELASTINE HCL 0.15 % NA SOLN: 1.0000 | Freq: Two times a day (BID) | NASAL | 2 refills | Status: DC | PRN

## 2018-08-23 MED ORDER — LEVOCETIRIZINE DIHYDROCHLORIDE 5 MG PO TABS: 5.0000 mg | ORAL_TABLET | Freq: Every day | ORAL | 2 refills | Status: DC | PRN

## 2018-08-23 MED ORDER — OLOPATADINE HCL 0.2 % OP SOLN: 1.0000 [drp] | OPHTHALMIC | 2 refills | Status: DC

## 2018-08-23 NOTE — Patient Instructions (Signed)
Perennial and seasonal allergic rhinitis  Aeroallergen avoidance measures have been discussed and provided in written form.  A prescription has been provided for azelastine nasal spray, 1-2 sprays per nostril 2 times daily as needed. Proper nasal spray technique has been discussed and demonstrated.   Nasal saline spray (i.e., Simply Saline) or nasal saline lavage (i.e., NeilMed) is recommended as needed and prior to medicated nasal sprays.  A prescription has been provided for levocetirizine 2.5 to 5 mg daily as needed.  To avoid diminishing benefit with daily use (tachyphylaxis) of second generation antihistamine, consider alternating every few months between fexofenadine (Allegra) and levocetirizine (Xyzal).  The risks and benefits of aeroallergen immunotherapy have been discussed. The patient is motivated to initiate immunotherapy to reduce symptoms and decrease medication requirement. Informed consent has been signed and allergen vaccine orders have been submitted. Medications will be decreased or discontinued as symptom relief from immunotherapy becomes evident.  Allergic conjunctivitis  Treatment plan as outlined above for allergic rhinitis.  A prescription has been provided for Pataday, one drop per eye daily as needed.  I have also recommended eye lubricant drops (i.e., Natural Tears) as needed.   Return in about 3 months (around 11/23/2018), or if symptoms worsen or fail to improve.  Control of Dust Mite Allergen  House dust mites play a major role in allergic asthma and rhinitis.  They occur in environments with high humidity wherever human skin, the food for dust mites is found. High levels have been detected in dust obtained from mattresses, pillows, carpets, upholstered furniture, bed covers, clothes and soft toys.  The principal allergen of the house dust mite is found in its feces.  A gram of dust may contain 1,000 mites and 250,000 fecal particles.  Mite antigen is easily  measured in the air during house cleaning activities.    1. Encase mattresses, including the box spring, and pillow, in an air tight cover.  Seal the zipper end of the encased mattresses with wide adhesive tape. 2. Wash the bedding in water of 130 degrees Farenheit weekly.  Avoid cotton comforters/quilts and flannel bedding: the most ideal bed covering is the dacron comforter. 3. Remove all upholstered furniture from the bedroom. 4. Remove carpets, carpet padding, rugs, and non-washable window drapes from the bedroom.  Wash drapes weekly or use plastic window coverings. 5. Remove all non-washable stuffed toys from the bedroom.  Wash stuffed toys weekly. 6. Have the room cleaned frequently with a vacuum cleaner and a damp dust-mop.  The patient should not be in a room which is being cleaned and should wait 1 hour after cleaning before going into the room. 7. Close and seal all heating outlets in the bedroom.  Otherwise, the room will become filled with dust-laden air.  An electric heater can be used to heat the room. Reduce indoor humidity to less than 50%.  Do not use a humidifier.   Reducing Pollen Exposure  The American Academy of Allergy, Asthma and Immunology suggests the following steps to reduce your exposure to pollen during allergy seasons.    1. Do not hang sheets or clothing out to dry; pollen may collect on these items. 2. Do not mow lawns or spend time around freshly cut grass; mowing stirs up pollen. 3. Keep windows closed at night.  Keep car windows closed while driving. 4. Minimize morning activities outdoors, a time when pollen counts are usually at their highest. 5. Stay indoors as much as possible when pollen counts or humidity is  high and on windy days when pollen tends to remain in the air longer. 6. Use air conditioning when possible.  Many air conditioners have filters that trap the pollen spores. 7. Use a HEPA room air filter to remove pollen form the indoor air you  breathe.   Control of Dog or Cat Allergen  Avoidance is the best way to manage a dog or cat allergy. If you have a dog or cat and are allergic to dog or cats, consider removing the dog or cat from the home. If you have a dog or cat but don't want to find it a new home, or if your family wants a pet even though someone in the household is allergic, here are some strategies that may help keep symptoms at bay:  1. Keep the pet out of your bedroom and restrict it to only a few rooms. Be advised that keeping the dog or cat in only one room will not limit the allergens to that room. 2. Don't pet, hug or kiss the dog or cat; if you do, wash your hands with soap and water. 3. High-efficiency particulate air (HEPA) cleaners run continuously in a bedroom or living room can reduce allergen levels over time. 4. Place electrostatic material sheet in the air inlet vent in the bedroom. 5. Regular use of a high-efficiency vacuum cleaner or a central vacuum can reduce allergen levels. 6. Giving your dog or cat a bath at least once a week can reduce airborne allergen.   Control of Mold Allergen  Mold and fungi can grow on a variety of surfaces provided certain temperature and moisture conditions exist.  Outdoor molds grow on plants, decaying vegetation and soil.  The major outdoor mold, Alternaria and Cladosporium, are found in very high numbers during hot and dry conditions.  Generally, a late Summer - Fall peak is seen for common outdoor fungal spores.  Rain will temporarily lower outdoor mold spore count, but counts rise rapidly when the rainy period ends.  The most important indoor molds are Aspergillus and Penicillium.  Dark, humid and poorly ventilated basements are ideal sites for mold growth.  The next most common sites of mold growth are the bathroom and the kitchen.  Outdoor [employer] 1. Use air conditioning and keep windows closed 2. Avoid exposure to decaying vegetation. 3. Avoid leaf  raking. 4. Avoid grain handling. 5. Consider wearing a face mask if working in moldy areas.  Indoor Mold Control 1. Maintain humidity below 50%. 2. Clean washable surfaces with 5% bleach solution. 3. Remove sources e.g. Contaminated carpets.   Control of Cockroach Allergen  Cockroach allergen has been identified as an important cause of acute attacks of asthma, especially in urban settings.  There are fifty-five species of cockroach that exist in the [place], however only three, the [place], [place] species produce allergen that can affect patients with Asthma.  Allergens can be obtained from fecal particles, egg casings and secretions from cockroaches.    1. Remove food sources. 2. Reduce access to water. 3. Seal access and entry points. 4. Spray runways with 0.5-1% Diazinon or Chlorpyrifos 5. Blow boric acid power under stoves and refrigerator. 6. Place bait stations (hydramethylnon) at feeding sites.

## 2018-08-23 NOTE — Assessment & Plan Note (Signed)
   Aeroallergen avoidance measures have been discussed and provided in written form.  A prescription has been provided for azelastine nasal spray, 1-2 sprays per nostril 2 times daily as needed. Proper nasal spray technique has been discussed and demonstrated.   Nasal saline spray (i.e., Simply Saline) or nasal saline lavage (i.e., NeilMed) is recommended as needed and prior to medicated nasal sprays.  A prescription has been provided for levocetirizine 2.5 to 5 mg daily as needed.  To avoid diminishing benefit with daily use (tachyphylaxis) of second generation antihistamine, consider alternating every few months between fexofenadine (Allegra) and levocetirizine (Xyzal).  The risks and benefits of aeroallergen immunotherapy have been discussed. The patient is motivated to initiate immunotherapy to reduce symptoms and decrease medication requirement. Informed consent has been signed and allergen vaccine orders have been submitted. Medications will be decreased or discontinued as symptom relief from immunotherapy becomes evident.

## 2018-08-23 NOTE — Assessment & Plan Note (Addendum)
Well-controlled with daily antihistamines.  The pruritus is mild if she is not taking the antihistamines and she has not experienced urticaria in a few years.  She has not experienced symptoms concerning for anaphylaxis or underlying malignancy.  Food allergen skin testing revealed borderline positive result to fish mix. However the patient consumes fish on a regular basis without symptoms, therefore this represents a false positive result.  Otherwise, all food allergen skin tests were negative despite a positive histamine control.  For now, continue antihistamines.  If this problem persists, progresses, or if she experiences symptoms concerning for anaphylaxis or constitutional symptoms, we will evaluate further.  Should symptoms recur, a journal is to be kept recording any foods eaten, beverages consumed, and medications taken within a 6 hour time period prior to the onset of symptoms, as well as record activities being performed, and environmental conditions. For any symptoms concerning for anaphylaxis, 911 is to be called immediately.

## 2018-08-23 NOTE — Assessment & Plan Note (Signed)
   Treatment plan as outlined above for allergic rhinitis.  A prescription has been provided for Pataday, one drop per eye daily as needed.  I have also recommended eye lubricant drops (i.e., Natural Tears) as needed. 

## 2018-08-23 NOTE — Progress Notes (Signed)
New Patient Note  RE: Karen Fox MRN: [id number] DOB: 06-26-1945 Date of Office Visit: 08/23/2018  Referring provider: Heywood Bene, * Primary care provider: Heywood Bene, PA-C  Chief Complaint: Allergies, Nasal Congestion, Sore Throat (throat clearing), Burning Eyes, and Pruritus   History of present illness: Karen Fox is a 73 y.o. female seen today in consultation requested by Clyde Lundborg, PA-C. She complains of nasal congestion, rhinorrhea, sneezing, postnasal drainage, throat clearing, nasal pruritus, and ocular pruritus "all the time."  She reports that she has "always had allergies" since childhood.  She received immunotherapy injections in the past with benefit, however symptoms have gradually returned.  She has tried fluticasone nasal spray in the past but complains that it "dries (her) out so bad".  No significant seasonal symptom variation has been noted nor have specific environmental triggers been identified.  Her symptoms used to be more severe during the springtime, however she is uncertain now because she attempts to stay indoors during the springtime.  She tries to avoid products with strong aromas because of strong aromas, such as scented candles and perfumes, tend to trigger postnasal drainage and headaches.  She takes antihistamines on a daily basis and reports that if she does not take antihistamines daily she began to experience mild generalized pruritus.  She has had generalized urticaria several years in the past, however this has not occurred recently or while taking antihistamines.  She has not experienced symptoms concerning for anaphylaxis or constitutional symptoms.  Assessment and plan: Perennial and seasonal allergic rhinitis  Aeroallergen avoidance measures have been discussed and provided in written form.  A prescription has been provided for azelastine nasal spray, 1-2 sprays per nostril 2 times daily as needed. Proper nasal  spray technique has been discussed and demonstrated.   Nasal saline spray (i.e., Simply Saline) or nasal saline lavage (i.e., NeilMed) is recommended as needed and prior to medicated nasal sprays.  A prescription has been provided for levocetirizine 2.5 to 5 mg daily as needed.  To avoid diminishing benefit with daily use (tachyphylaxis) of second generation antihistamine, consider alternating every few months between fexofenadine (Allegra) and levocetirizine (Xyzal).  The risks and benefits of aeroallergen immunotherapy have been discussed. The patient is motivated to initiate immunotherapy to reduce symptoms and decrease medication requirement. Informed consent has been signed and allergen vaccine orders have been submitted. Medications will be decreased or discontinued as symptom relief from immunotherapy becomes evident.  Allergic conjunctivitis  Treatment plan as outlined above for allergic rhinitis.  A prescription has been provided for Pataday, one drop per eye daily as needed.  I have also recommended eye lubricant drops (i.e., Natural Tears) as needed.  Pruritus and history of urticaria Well-controlled with daily antihistamines.  The pruritus is mild if she is not taking the antihistamines and she has not experienced urticaria in a few years.  She has not experienced symptoms concerning for anaphylaxis or underlying malignancy.  Food allergen skin testing revealed borderline positive result to fish mix. However the patient consumes fish on a regular basis without symptoms, therefore this represents a false positive result.  Otherwise, all food allergen skin tests were negative despite a positive histamine control.  For now, continue antihistamines.  If this problem persists, progresses, or if she experiences symptoms concerning for anaphylaxis or constitutional symptoms, we will evaluate further.  Should symptoms recur, a journal is to be kept recording any foods eaten, beverages  consumed, and medications taken within a 6 hour time  period prior to the onset of symptoms, as well as record activities being performed, and environmental conditions. For any symptoms concerning for anaphylaxis, 911 is to be called immediately.   Meds ordered this encounter  Medications  . Azelastine HCl 0.15 % SOLN    Sig: Place 1-2 sprays into both nostrils 2 (two) times daily as needed.    Dispense:  30 mL    Refill:  2  . levocetirizine (XYZAL) 5 MG tablet    Sig: Take 1 tablet (5 mg total) by mouth daily as needed for allergies.    Dispense:  30 tablet    Refill:  2  . Olopatadine HCl (PATADAY) 0.2 % SOLN    Sig: Place 1 drop into both eyes 1 day or 1 dose.    Dispense:  2.5 mL    Refill:  2    Diagnostics: Environmental skin testing: Positive to weed mix pollen, multiple molds, cat hair, cockroach antigen, and dust mite antigen. Food allergen skin testing: Borderline positive to fish mix, however the patient consumes fish on a regular basis without symptoms, therefore this represents a false positive result.  Otherwise, all food allergen skin tests were negative despite a positive histamine control.   Physical examination: Blood pressure 138/82, pulse 88, temperature (!) 96 F (35.6 C), temperature source Temporal, resp. rate 18, height 5\' 6"  (1.676 m), weight 164 lb 12.8 oz (74.8 kg), SpO2 96 %.  General: Alert, interactive, in no acute distress. HEENT: TMs pearly gray, turbinates moderately edematous without discharge, post-pharynx moderately erythematous. Neck: Supple without lymphadenopathy. Lungs: Clear to auscultation without wheezing, rhonchi or rales. CV: Normal S1, S2 without murmurs. Abdomen: Nondistended, nontender. Skin: Warm and dry, without lesions or rashes. Extremities:  No clubbing, cyanosis or edema. Neuro:   Grossly intact.  Review of systems:  Review of systems negative except as noted in HPI / PMHx or noted below: Review of Systems  Constitutional:  Negative.   HENT: Negative.   Eyes: Negative.   Respiratory: Negative.   Cardiovascular: Negative.   Gastrointestinal: Negative.   Genitourinary: Negative.   Musculoskeletal: Negative.   Skin: Negative.   Neurological: Negative.   Endo/Heme/Allergies: Negative.   Psychiatric/Behavioral: Negative.     Past medical history:  Past Medical History:  Diagnosis Date  . Allergy   . Anemia    years ago before hysterectomy  . Anxiety    little  . Arthritis    feet  . Blood transfusion without reported diagnosis    34 years ago  . GERD (gastroesophageal reflux disease)    mild  . History of urticaria 08/23/2018  . Hyperlipidemia   . Hypertension   . Osteopenia    uses vitamin D for this     Past surgical history:  Past Surgical History:  Procedure Laterality Date  . CARDIAC CATHETERIZATION  12/11/2008   No intervention - continue medical therapy.  Marland Kitchen CARDIOVASCULAR STRESS TEST  01/26/2012   Mild ischemia in the distal LAD artery distribution.  . COLONOSCOPY    . SINOSCOPY    . TRANSTHORACIC ECHOCARDIOGRAM  03/23/2008   EF 68%, no regional wall motion abnormalities noted, no significant valvular abnormalities.    Family history: Family History  Problem Relation Age of Onset  . Kidney failure Mother   . Cancer Father        Colon cancer  . Stroke Father   . Heart disease Father   . Colon cancer Father   . Mitral valve prolapse Sister   .  Heart attack Brother   . Heart disease Brother   . Colon polyps Brother   . Heart attack Brother   . Heart disease Brother   . Hyperlipidemia Brother   . Colon polyps Brother   . Clotting disorder Brother   . Colon polyps Brother   . Heart attack Brother   . Colon polyps Brother   . Esophageal cancer Neg Hx   . Rectal cancer Neg Hx   . Stomach cancer Neg Hx     Social history: Social History   Socioeconomic History  . Marital status: Married    Spouse name: Not on file  . Number of children: Not on file  . Years of  education: Not on file  . Highest education level: Not on file  Occupational History  . Not on file  Social Needs  . Financial resource strain: Not on file  . Food insecurity    Worry: Not on file    Inability: Not on file  . Transportation needs    Medical: Not on file    Non-medical: Not on file  Tobacco Use  . Smoking status: Never Smoker  . Smokeless tobacco: Never Used  Substance and Sexual Activity  . Alcohol use: No    Alcohol/week: 0.0 standard drinks  . Drug use: No  . Sexual activity: Not on file  Lifestyle  . Physical activity    Days per week: Not on file    Minutes per session: Not on file  . Stress: Not on file  Relationships  . Social Herbalist on phone: Not on file    Gets together: Not on file    Attends religious service: Not on file    Active member of club or organization: Not on file    Attends meetings of clubs or organizations: Not on file    Relationship status: Not on file  . Intimate partner violence    Fear of current or ex partner: Not on file    Emotionally abused: Not on file    Physically abused: Not on file    Forced sexual activity: Not on file  Other Topics Concern  . Not on file  Social History Narrative  . Not on file   Environmental History: The patient lives in a 72 year old house with carpeting throughout and central air/heat.  There is no known mold/water damage in the home.  There are no pets in the home.  She is a non-smoker.  Allergies as of 08/23/2018      Reactions   Amoxicillin Nausea Only   Contrast Media [iodinated Diagnostic Agents] Hives   Doxycycline Other (See Comments)   Patient does not recall reaction   Erythromycin    REACTION: rash   Propoxyphene Other (See Comments)   Hyper Darvon      Medication List       Accurate as of August 23, 2018  1:21 PM. If you have any questions, ask your nurse or doctor.        ALPRAZolam 0.5 MG tablet Commonly known as: XANAX Take 0.5 mg by mouth at  bedtime as needed for anxiety.   Azelastine HCl 0.15 % Soln Place 1-2 sprays into both nostrils 2 (two) times daily as needed. Started by: Edmonia Lynch, MD   chlorpheniramine 4 MG tablet Commonly known as: CHLOR-TRIMETON Take 4 mg by mouth 2 (two) times daily as needed for allergies.   citalopram 20 MG tablet Commonly known as: CELEXA Take 1  tablet by mouth daily.   fenofibrate 160 MG tablet Take 160 mg by mouth daily.   fexofenadine 180 MG tablet Commonly known as: ALLEGRA Take 180 mg by mouth daily.   fluticasone 50 MCG/ACT nasal spray Commonly known as: FLONASE Place 1-2 sprays into both nostrils daily. Reported on 06/06/2015   fosinopril 10 MG tablet Commonly known as: MONOPRIL Take 20 mg by mouth daily.   levocetirizine 5 MG tablet Commonly known as: XYZAL Take 1 tablet (5 mg total) by mouth daily as needed for allergies. Started by: Edmonia Lynch, MD   Olopatadine HCl 0.2 % Soln Commonly known as: Pataday Place 1 drop into both eyes 1 day or 1 dose. Started by: Edmonia Lynch, MD   Omega 3 1000 MG Caps Take 1 capsule by mouth daily. Reported on 06/27/2015   omeprazole 40 MG capsule Commonly known as: PRILOSEC omeprazole 40 mg capsule,delayed release   PROBIOTIC DAILY PO Take by mouth.   Vitamin D3 50 MCG (2000 UT) capsule Take 5,000 Units by mouth daily.   Zebutal 50-325-40 MG capsule Generic drug: Butalbital-APAP-Caffeine Take 1 capsule by mouth every 4 (four) hours as needed for pain.       Known medication allergies: Allergies  Allergen Reactions  . Amoxicillin Nausea Only  . Contrast Media [Iodinated Diagnostic Agents] Hives  . Doxycycline Other (See Comments)    Patient does not recall reaction  . Erythromycin     REACTION: rash  . Propoxyphene Other (See Comments)    Hyper Darvon    I appreciate the opportunity to take part in Karen Fox's care. Please do not hesitate to contact me with questions.  Sincerely,   R. Edgar Frisk, MD

## 2018-11-11 ENCOUNTER — Other Ambulatory Visit: Payer: Self-pay | Admitting: Orthopedic Surgery

## 2018-11-11 DIAGNOSIS — M25511 Pain in right shoulder: Secondary | ICD-10-CM

## 2018-11-15 ENCOUNTER — Ambulatory Visit: Payer: Medicare Other | Admitting: Allergy and Immunology

## 2018-11-22 ENCOUNTER — Other Ambulatory Visit: Payer: Self-pay

## 2018-11-22 ENCOUNTER — Ambulatory Visit
Admission: RE | Admit: 2018-11-22 | Discharge: 2018-11-22 | Disposition: A | Discharge status: Home / Self Care | Payer: Medicare Other | Source: Ambulatory Visit | Attending: Orthopedic Surgery | Admitting: Orthopedic Surgery

## 2018-11-22 DIAGNOSIS — M25511 Pain in right shoulder: Secondary | ICD-10-CM

## 2018-12-19 ENCOUNTER — Other Ambulatory Visit: Payer: Self-pay

## 2018-12-19 MED ORDER — LEVOCETIRIZINE DIHYDROCHLORIDE 5 MG PO TABS: 5.0000 mg | ORAL_TABLET | Freq: Every day | ORAL | 0 refills | Status: DC | PRN

## 2019-01-10 ENCOUNTER — Ambulatory Visit: Payer: Medicare Other | Admitting: Allergy and Immunology

## 2019-01-13 HISTORY — PX: SHOULDER SURGERY: SHX246

## 2019-05-12 ENCOUNTER — Encounter: Payer: Self-pay | Admitting: Gastroenterology

## 2019-05-17 ENCOUNTER — Encounter: Payer: Self-pay | Admitting: Gastroenterology

## 2019-07-11 ENCOUNTER — Other Ambulatory Visit: Payer: Self-pay

## 2019-07-11 ENCOUNTER — Ambulatory Visit (AMBULATORY_SURGERY_CENTER): Payer: Self-pay

## 2019-07-11 VITALS — Ht 66.0 in | Wt 163.8 lb

## 2019-07-11 DIAGNOSIS — Z86010 Personal history of colonic polyps: Secondary | ICD-10-CM

## 2019-07-11 DIAGNOSIS — Z800 Family history of malignant neoplasm of digestive organs: Secondary | ICD-10-CM

## 2019-07-11 NOTE — Progress Notes (Signed)
No allergies to soy or egg Pt is not on blood thinners or diet pills Denies issues with sedation/intubation Denies atrial flutter/fib Denies constipation   Pt is aware of Covid safety and care partner requirements.      

## 2019-07-19 ENCOUNTER — Encounter: Payer: Medicare Other | Admitting: Gastroenterology

## 2019-07-20 ENCOUNTER — Other Ambulatory Visit: Payer: Self-pay

## 2019-07-20 ENCOUNTER — Other Ambulatory Visit: Payer: Self-pay | Admitting: Gastroenterology

## 2019-07-20 ENCOUNTER — Ambulatory Visit (INDEPENDENT_AMBULATORY_CARE_PROVIDER_SITE_OTHER): Payer: Medicare Other

## 2019-07-20 DIAGNOSIS — Z1159 Encounter for screening for other viral diseases: Secondary | ICD-10-CM

## 2019-07-20 LAB — SARS CORONAVIRUS 2 (TAT 6-24 HRS): SARS Coronavirus 2: NEGATIVE

## 2019-07-25 ENCOUNTER — Ambulatory Visit (AMBULATORY_SURGERY_CENTER): Payer: Medicare Other | Admitting: Gastroenterology

## 2019-07-25 ENCOUNTER — Other Ambulatory Visit: Payer: Self-pay

## 2019-07-25 ENCOUNTER — Encounter: Payer: Self-pay | Admitting: Gastroenterology

## 2019-07-25 VITALS — BP 142/78 | HR 67 | Temp 97.7°F | Resp 17 | Ht 66.0 in | Wt 163.8 lb

## 2019-07-25 DIAGNOSIS — Z1211 Encounter for screening for malignant neoplasm of colon: Secondary | ICD-10-CM

## 2019-07-25 DIAGNOSIS — K5730 Diverticulosis of large intestine without perforation or abscess without bleeding: Secondary | ICD-10-CM

## 2019-07-25 DIAGNOSIS — Z86010 Personal history of colonic polyps: Secondary | ICD-10-CM

## 2019-07-25 DIAGNOSIS — K635 Polyp of colon: Secondary | ICD-10-CM

## 2019-07-25 DIAGNOSIS — K5010 Crohn's disease of large intestine without complications: Secondary | ICD-10-CM

## 2019-07-25 DIAGNOSIS — Z800 Family history of malignant neoplasm of digestive organs: Secondary | ICD-10-CM | POA: Diagnosis not present

## 2019-07-25 DIAGNOSIS — D122 Benign neoplasm of ascending colon: Secondary | ICD-10-CM

## 2019-07-25 MED ORDER — SODIUM CHLORIDE 0.9 % IV SOLN: 500.0000 mL | Freq: Once | INTRAVENOUS | Status: DC

## 2019-07-25 NOTE — Progress Notes (Signed)
VS by CW  Pt's states no medical or surgical changes since previsit or office visit.  

## 2019-07-25 NOTE — Progress Notes (Signed)
pt tolerated well. VSS. awake and to recovery. Report given to RN.  

## 2019-07-25 NOTE — Op Note (Signed)
[facility] Patient Name: Karen Fox Procedure Date: 07/25/2019 7:29 AM MRN: [id number] Endoscopist: Mauri Pole , MD Age: 74 Referring MD:  Date of Birth: 02-18-1945 Gender: Female Account #: [id number] Procedure:                Colonoscopy Indications:              High risk colon cancer surveillance: Personal                            history of colonic polyps, Surveillance: Piecemeal                            removal of large sessile adenoma last colonoscopy                            (< 3 yrs), High risk colon cancer surveillance:                            Personal history of adenoma (10 mm or greater in                            size) Medicines:                Monitored Anesthesia Care Procedure:                Pre-Anesthesia Assessment:                           - Prior to the procedure, a History and Physical                            was performed, and patient medications and                            allergies were reviewed. The patient's tolerance of                            previous anesthesia was also reviewed. The risks                            and benefits of the procedure and the sedation                            options and risks were discussed with the patient.                            All questions were answered, and informed consent                            was obtained. Prior Anticoagulants: The patient has                            taken no previous anticoagulant or antiplatelet  agents. ASA Grade Assessment: II - A patient with                            mild systemic disease. After reviewing the risks                            and benefits, the patient was deemed in                            satisfactory condition to undergo the procedure.                           After obtaining informed consent, the colonoscope                            was passed under direct vision. Throughout the                             procedure, the patient's blood pressure, pulse, and                            oxygen saturations were monitored continuously. The                            Colonoscope was introduced through the anus and                            advanced to the the cecum, identified by                            appendiceal orifice and ileocecal valve. The                            colonoscopy was performed without difficulty. The                            patient tolerated the procedure well. The quality                            of the bowel preparation was good. The ileocecal                            valve, appendiceal orifice, and rectum were                            photographed. Scope In: 8:11:10 AM Scope Out: 8:30:21 AM Scope Withdrawal Time: 0 hours 13 minutes 47 seconds  Total Procedure Duration: 0 hours 19 minutes 11 seconds  Findings:                 The perianal and digital rectal examinations were                            normal.  A 10 mm polyp was found in the ascending colon. The                            polyp was flat. The polyp was removed with a cold                            snare. Resection and retrieval were complete.                           Multiple small and large-mouthed diverticula were                            found in the sigmoid colon, descending colon,                            transverse colon, ascending colon and cecum.                            Peri-diverticular erythema was seen in the sigmoid                            colon. Biopsies were taken with a cold forceps for                            histology.                           Non-bleeding internal hemorrhoids were found during                            retroflexion. The hemorrhoids were large. Complications:            No immediate complications. Estimated Blood Loss:     Estimated blood loss was minimal. Impression:               - One 10 mm polyp  in the ascending colon, removed                            with a cold snare. Resected and retrieved.                           - Moderate diverticulosis in the sigmoid colon, in                            the descending colon, in the transverse colon, in                            the ascending colon and in the cecum.                            Peri-diverticular erythema was seen. Biopsied.                           - Non-bleeding internal hemorrhoids. Recommendation:           -  Patient has a contact number available for                            emergencies. The signs and symptoms of potential                            delayed complications were discussed with the                            patient. Return to normal activities tomorrow.                            Written discharge instructions were provided to the                            patient.                           - Resume previous diet.                           - Continue present medications.                           - Await pathology results.                           - Repeat colonoscopy in 3 - 5 years for                            surveillance based on pathology results. Mauri Pole, MD 07/25/2019 8:39:39 AM This report has been signed electronically.

## 2019-07-25 NOTE — Progress Notes (Signed)
No problems noted in the recovery room. maw 

## 2019-07-25 NOTE — Patient Instructions (Addendum)
YOU HAD AN ENDOSCOPIC PROCEDURE TODAY AT [facility]:   Refer to the procedure report that was given to you for any specific questions about what was found during the examination.  If the procedure report does not answer your questions, please call your gastroenterologist to clarify.  If you requested that your care partner not be given the details of your procedure findings, then the procedure report has been included in a sealed envelope for you to review at your convenience later.  YOU SHOULD EXPECT: Some feelings of bloating in the abdomen. Passage of more gas than usual.  Walking can help get rid of the air that was put into your GI tract during the procedure and reduce the bloating. If you had a lower endoscopy (such as a colonoscopy or flexible sigmoidoscopy) you may notice spotting of blood in your stool or on the toilet paper. If you underwent a bowel prep for your procedure, you may not have a normal bowel movement for a few days.  Please Note:  You might notice some irritation and congestion in your nose or some drainage.  This is from the oxygen used during your procedure.  There is no need for concern and it should clear up in a day or so.  SYMPTOMS TO REPORT IMMEDIATELY:   Following lower endoscopy (colonoscopy or flexible sigmoidoscopy):  Excessive amounts of blood in the stool  Significant tenderness or worsening of abdominal pains  Swelling of the abdomen that is new, acute  Fever of 100F or higher  For urgent or emergent issues, a gastroenterologist can be reached at any hour by calling [phone]. Do not use MyChart messaging for urgent concerns.    DIET:  We do recommend a small meal at first, but then you may proceed to your regular diet.  Drink plenty of fluids but you should avoid alcoholic beverages for 24 hours.  ACTIVITY:  You should plan to take it easy for the rest of today and you should NOT DRIVE or use heavy machinery until tomorrow (because  of the sedation medicines used during the test).    FOLLOW UP: Our staff will call the number listed on your records 48-72 hours following your procedure to check on you and address any questions or concerns that you may have regarding the information given to you following your procedure. If we do not reach you, we will leave a message.  We will attempt to reach you two times.  During this call, we will ask if you have developed any symptoms of COVID 19. If you develop any symptoms (ie: fever, flu-like symptoms, shortness of breath, cough etc.) before then, please call [phone].  If you test positive for Covid 19 in the 2 weeks post procedure, please call and report this information to [place].    If any biopsies were taken you will be contacted by phone or by letter within the next 1-3 weeks.  Please call us at [phone] if you have not heard about the biopsies in 3 weeks.    SIGNATURES/CONFIDENTIALITY: You and/or your care partner have signed paperwork which will be entered into your electronic medical record.  These signatures attest to the fact that that the information above on your After Visit Summary has been reviewed and is understood.  Full responsibility of the confidentiality of this discharge information lies with you and/or your care-partner.    Handouts were given to you on polyps, diverticulosis, and hemorrhoids. You may resume your current medications  today. Await biopsy results. Repeat colonoscopy in 3-5 years for surveillance based on the pathology results.  May take approximately 2 weeks to receive your pathology results. Please call if any questions or concerns.

## 2019-07-25 NOTE — Progress Notes (Signed)
Called to room to assist during endoscopic procedure.  Patient ID and intended procedure confirmed with present staff. Received instructions for my participation in the procedure from the performing physician.  

## 2019-07-27 ENCOUNTER — Telehealth: Payer: Self-pay

## 2019-07-27 NOTE — Telephone Encounter (Signed)
  Follow up Call-  Call back number 07/25/2019 07/25/2018  Post procedure Call Back phone  # [phone]  Permission to leave phone message Yes Yes  Some recent data might be hidden     Patient questions:  Do you have a fever, pain , or abdominal swelling? No. Pain Score  0 *  Have you tolerated food without any problems? Yes.    Have you been able to return to your normal activities? Yes.    Do you have any questions about your discharge instructions: Diet   No. Medications  No. Follow up visit  No.  Do you have questions or concerns about your Care? No.  Actions: * If pain score is 4 or above: No action needed, pain <4.  1. Have you developed a fever since your procedure? no  2.   Have you had an respiratory symptoms (SOB or cough) since your procedure? no  3.   Have you tested positive for COVID 19 since your procedure no  4.   Have you had any family members/close contacts diagnosed with the COVID 19 since your procedure?  no   If yes to any of these questions please route to Joylene John, RN and Erenest Rasher, RN

## 2019-07-27 NOTE — Telephone Encounter (Signed)
NO ANSWER, MESSAGE LEFT FOR PATIENT. 

## 2019-08-03 ENCOUNTER — Encounter: Payer: Self-pay | Admitting: Gastroenterology

## 2021-03-04 IMAGING — MR MR SHOULDER*R* W/O CM
4 of 6 series · 20 of 40 positions shown · non-contrast
Comparison: None.

CLINICAL DATA: Shoulder pain, limited range of motion he which to

EXAM:
MRI OF THE RIGHT SHOULDER WITHOUT CONTRAST
TECHNIQUE: Multiplanar, multisequence MR imaging of the shoulder was performed.
No intravenous contrast was administered.

[Series 6: PD fat-sat · axial · right · 4.0mm · 0.44mm/px · z∈[-48,+50]mm · 8 of 22 slices shown (1 of 2)]
[im 1/22]
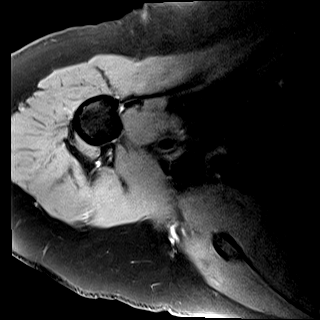
[im 4/22]
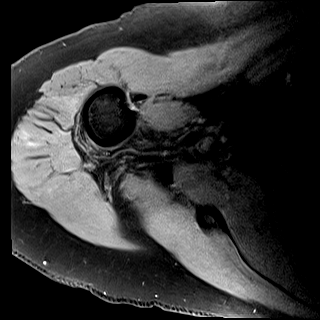
[im 7/22]
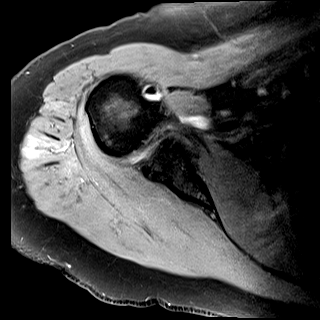
[im 10/22]
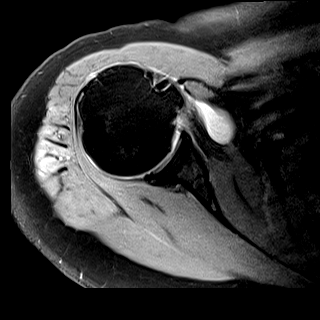
[im 13/22]
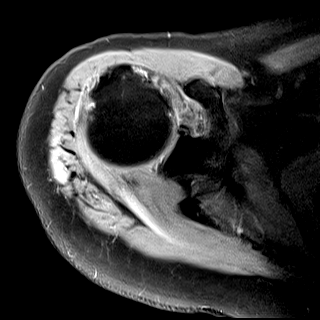
[im 16/22]
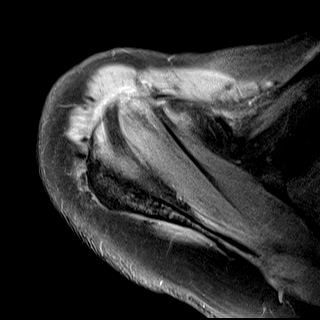
[im 19/22]
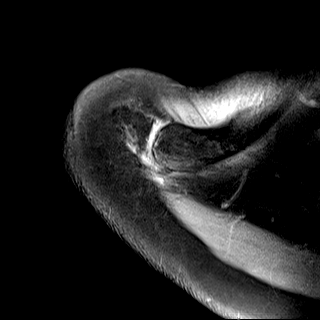
[im 22/22]
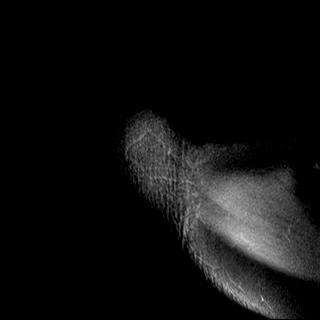

[Series 7: T2 fat-sat · oblique · right · 4.0mm · 0.22mm/px · 3 of 21 slices shown (1 of 2)]
[im 5/21]
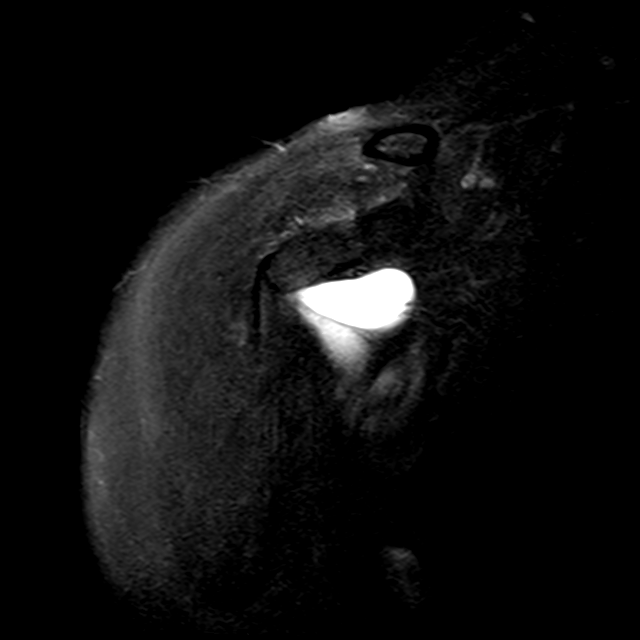
[im 13/21]
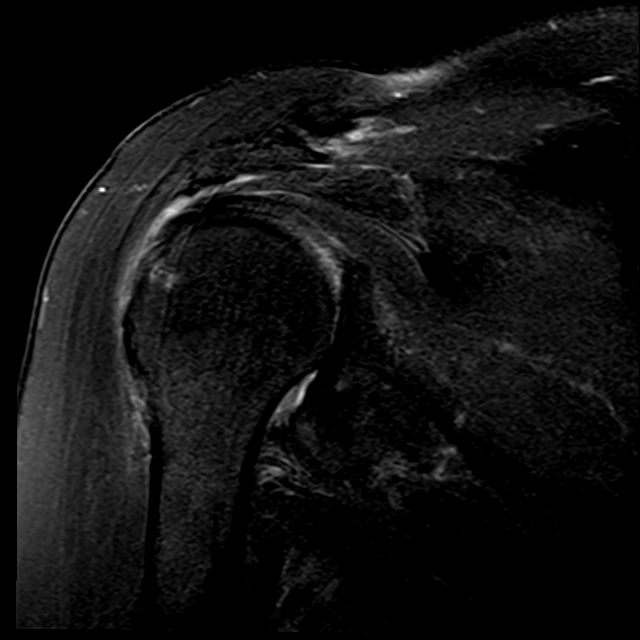
[im 21/21]
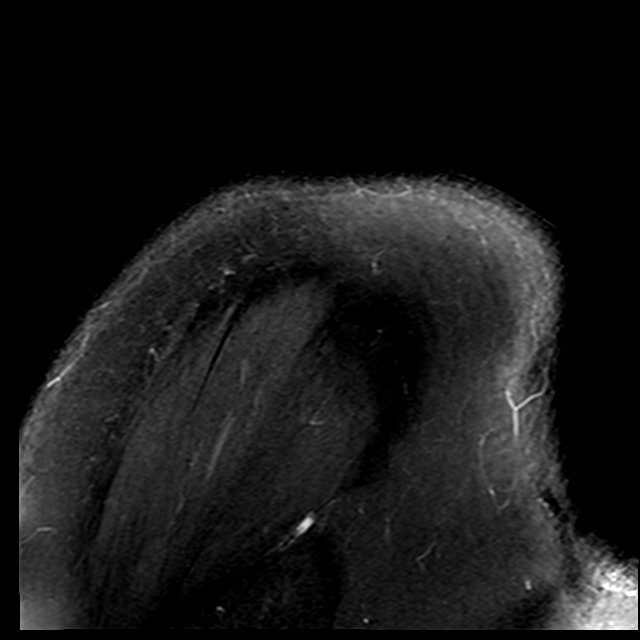

[Series 8: PD fat-sat · oblique · right · 4.0mm · 0.22mm/px · 6 of 21 slices shown (2 of 2)]
[im 1/21]
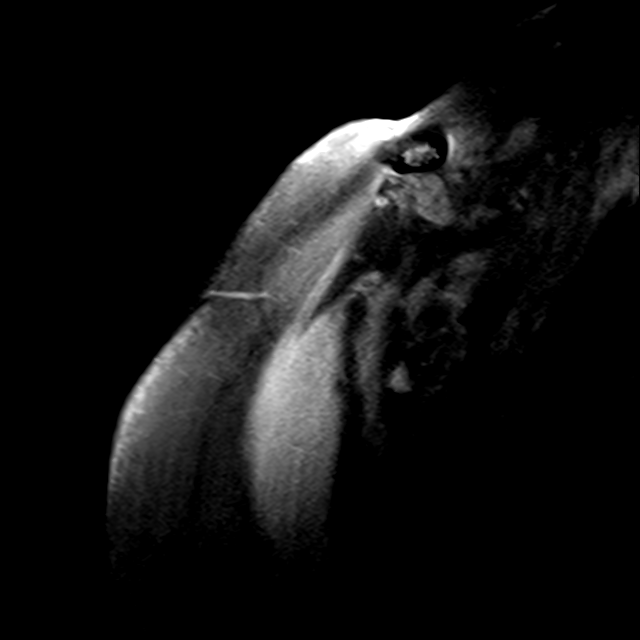
[im 5/21]
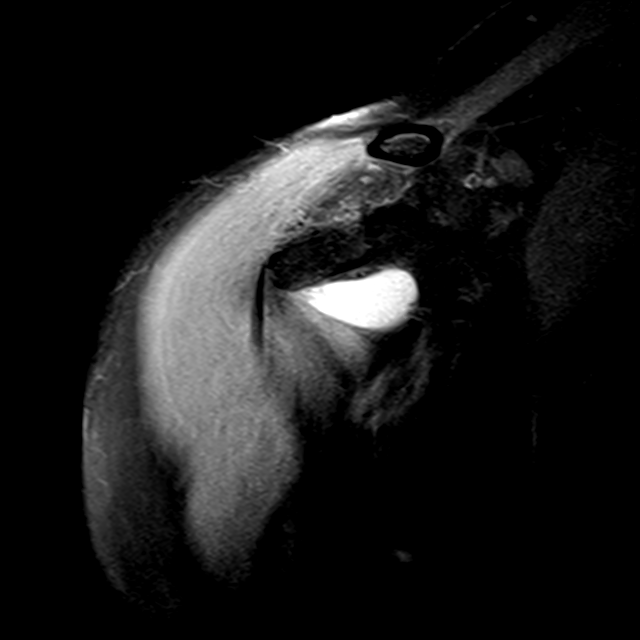
[im 9/21]
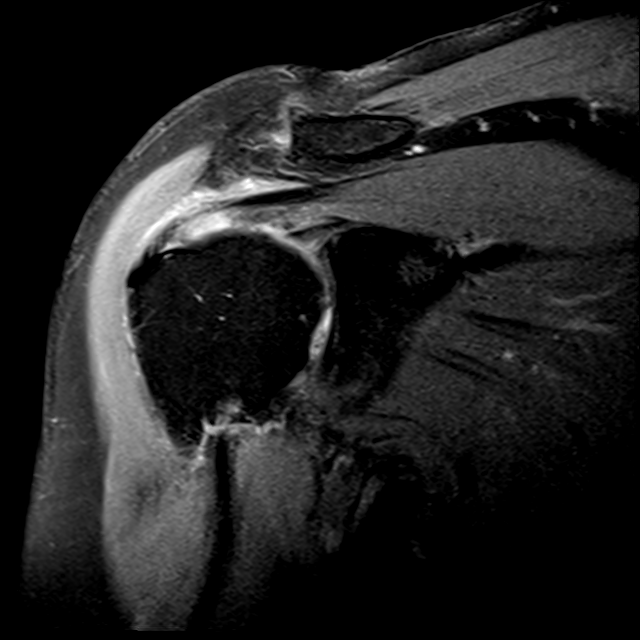
[im 13/21]
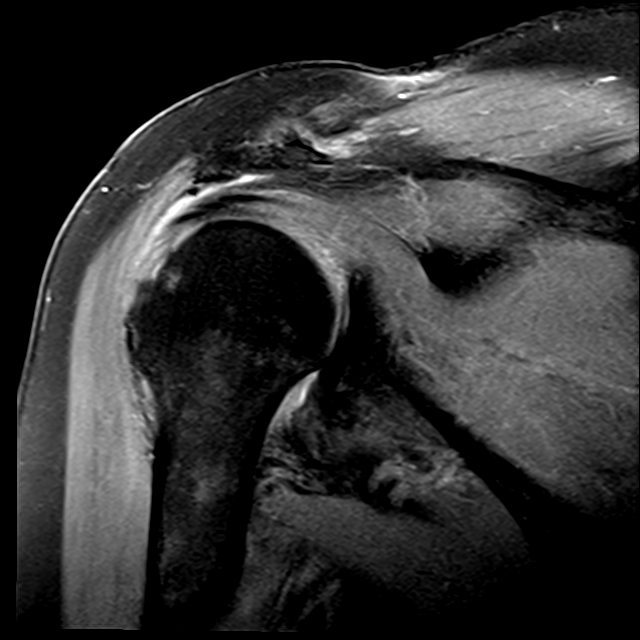
[im 17/21]
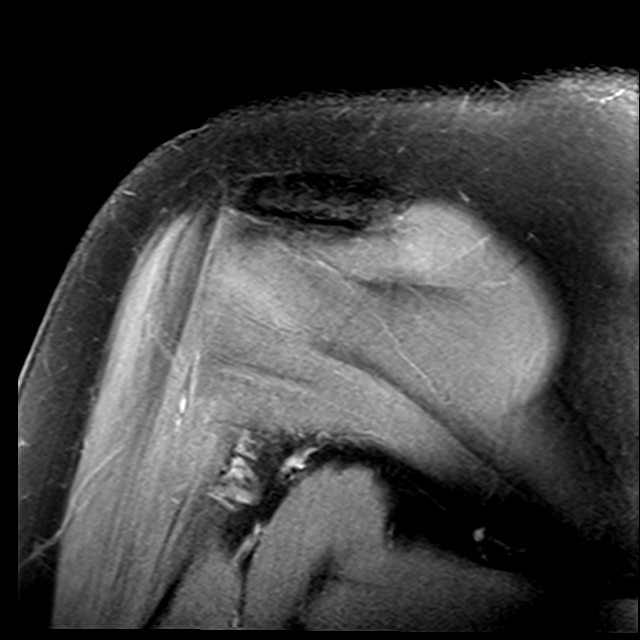
[im 21/21]
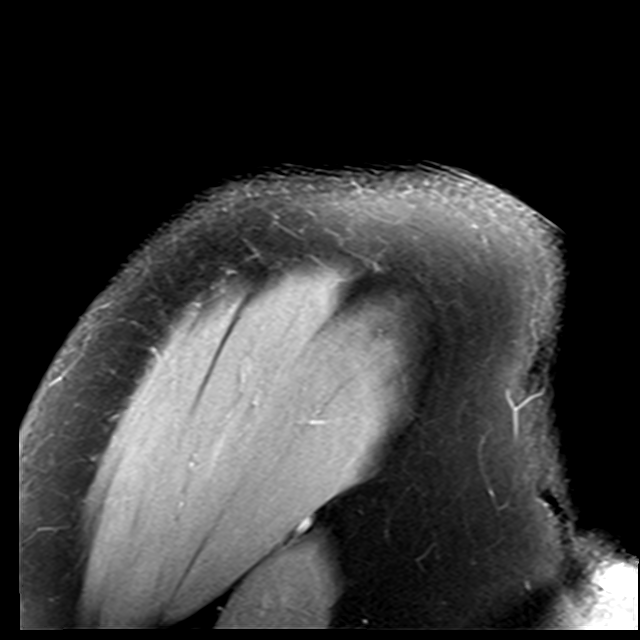

[Series 9: T2 fat-sat · oblique · right · 4.0mm · 0.44mm/px · 3 of 23 slices shown (2 of 2)]
[im 4/23]
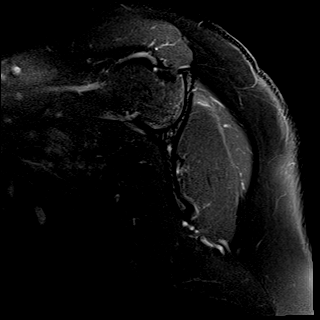
[im 12/23]
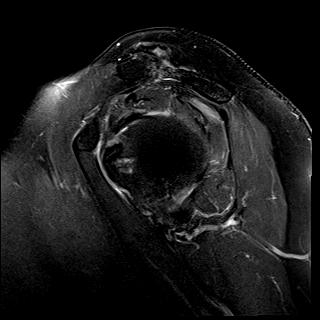
[im 19/23]
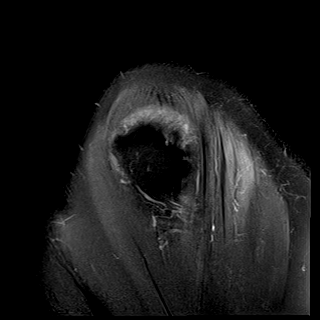

[20 of 40 positions shown; findings below may reference images not displayed]

FINDINGS: Rotator cuff: There is a tiny focal full-thickness tear of the
superior subscapularis tendon measuring 5 mm in transverse
dimension. There is resultant small amount of fluid in the
subacromial-subdeltoid bursa. There is increased globular signal
thickening seen throughout the remainder of the subscapularis and
supraspinatus tendons. The teres minor and infraspinatus tendons
are intact. The muscles of the rotator cuff are normal without tear,
edema, or atrophy.

Muscles: The muscles other than the rotator cuff are normal without
tear, edema, or atrophy.

Biceps Long Head: The Intraarticular and extraarticular portions of
the biceps tendon are normal in position, size and signal.

Acromioclavicular Joint: Moderate AC joint arthrosis seen with
capsular hypertrophy and joint space loss. Type 1 acromion.

Glenohumeral Joint: Mild glenohumeral joint thinning is noted. The
glenohumeral joint alignment is well maintained. There is a small
glenohumeral joint effusion.

Labrum: There is attenuation of the superior labrum. No displaced
labral fragment seen. A nondisplaced tear is noted of the anterior
superior labrum.

Bones: No fracture, osteonecrosis, or pathologic marrow
infiltration.

Other: Trace subacromial-subdeltoid bursal fluid is seen.
IMPRESSION: 1. 5 mm tiny focal full-thickness tear of the superior subscapularis
tendon.
2. Supraspinatus and subscapularis tendinosis.
3. Mild glenohumeral joint chondral disease.
4. Superior labral degeneration with a nondisplaced tear of the
anterosuperior labrum.
5. Moderate AC joint arthrosis

## 2022-06-10 ENCOUNTER — Telehealth: Payer: Self-pay | Admitting: Gastroenterology

## 2022-06-10 NOTE — Telephone Encounter (Signed)
She can be scheduled as direct if no active GI problems. Thanks

## 2022-06-10 NOTE — Telephone Encounter (Signed)
Hi Dr. Lavon Paganini,  This patient called to schedule colonoscopy that she is due for in July. Would you like for her to be schedule directly? Or would you like to see her in office prior to colonoscopy? Please advise.   Thank you.

## 2022-06-11 ENCOUNTER — Encounter: Payer: Self-pay | Admitting: Gastroenterology

## 2022-08-10 ENCOUNTER — Ambulatory Visit (AMBULATORY_SURGERY_CENTER): Payer: Medicare HMO

## 2022-08-10 VITALS — Ht 65.0 in | Wt 161.0 lb

## 2022-08-10 DIAGNOSIS — Z86010 Personal history of colonic polyps: Secondary | ICD-10-CM

## 2022-08-10 MED ORDER — NA SULFATE-K SULFATE-MG SULF 17.5-3.13-1.6 GM/177ML PO SOLN
1.0000 | Freq: Once | ORAL | 0 refills | Status: AC
Start: 2022-08-10 — End: 2022-08-10

## 2022-08-10 NOTE — Progress Notes (Signed)

## 2022-09-01 ENCOUNTER — Encounter: Payer: Self-pay | Admitting: Gastroenterology

## 2022-09-01 ENCOUNTER — Ambulatory Visit (AMBULATORY_SURGERY_CENTER): Payer: Medicare HMO | Admitting: Gastroenterology

## 2022-09-01 VITALS — BP 117/72 | HR 68 | Temp 99.1°F | Resp 17 | Ht 65.0 in | Wt 161.0 lb

## 2022-09-01 DIAGNOSIS — D123 Benign neoplasm of transverse colon: Secondary | ICD-10-CM

## 2022-09-01 DIAGNOSIS — Z09 Encounter for follow-up examination after completed treatment for conditions other than malignant neoplasm: Secondary | ICD-10-CM | POA: Diagnosis present

## 2022-09-01 DIAGNOSIS — Z86010 Personal history of colonic polyps: Secondary | ICD-10-CM

## 2022-09-01 DIAGNOSIS — D120 Benign neoplasm of cecum: Secondary | ICD-10-CM

## 2022-09-01 HISTORY — PX: COLONOSCOPY W/ POLYPECTOMY: SHX1380

## 2022-09-01 MED ORDER — SODIUM CHLORIDE 0.9 % IV SOLN: 500.0000 mL | Freq: Once | INTRAVENOUS | Status: DC

## 2022-09-01 NOTE — Op Note (Signed)
[facility] Patient Name: Karen Fox Procedure Date: 09/01/2022 9:10 AM MRN: [id number] Endoscopist: Napoleon Form , MD, [id number] Age: 77 Referring MD:  Date of Birth: Oct 02, 1945 Gender: Female Account #: [id number] Procedure:                Colonoscopy Indications:              Colon cancer screening in patient at increased                            risk: Colorectal cancer in father, High risk colon                            cancer surveillance: Personal history of colonic                            polyps Medicines:                Monitored Anesthesia Care Procedure:                Pre-Anesthesia Assessment:                           - Prior to the procedure, a History and Physical                            was performed, and patient medications and                            allergies were reviewed. The patient's tolerance of                            previous anesthesia was also reviewed. The risks                            and benefits of the procedure and the sedation                            options and risks were discussed with the patient.                            All questions were answered, and informed consent                            was obtained. Prior Anticoagulants: The patient has                            taken no anticoagulant or antiplatelet agents. ASA                            Grade Assessment: II - A patient with mild systemic                            disease. After reviewing the risks and benefits,  the patient was deemed in satisfactory condition to                            undergo the procedure.                           After obtaining informed consent, the colonoscope                            was passed under direct vision. Throughout the                            procedure, the patient's blood pressure, pulse, and                            oxygen saturations were monitored continuously.  The                            [id number]-HQ190L Colonoscope [id number] was introduced                            through the anus and advanced to the the cecum,                            identified by appendiceal orifice and ileocecal                            valve. The colonoscopy was performed without                            difficulty. The patient tolerated the procedure                            well. The quality of the bowel preparation was                            good. The ileocecal valve, appendiceal orifice, and                            rectum were photographed. Scope In: 9:22:43 AM Scope Out: 9:38:28 AM Scope Withdrawal Time: 0 hours 8 minutes 32 seconds  Total Procedure Duration: 0 hours 15 minutes 45 seconds  Findings:                 The perianal and digital rectal examinations were                            normal.                           Seven sessile polyps were found in the transverse                            colon and cecum. The polyps were 3 to 5 mm in size.  These polyps were removed with a cold snare.                            Resection and retrieval were complete.                           Scattered large-mouthed, medium-mouthed and                            small-mouthed diverticula were found in the sigmoid                            colon and descending colon.                           Non-bleeding external and internal hemorrhoids were                            found during retroflexion. The hemorrhoids were                            medium-sized. Complications:            No immediate complications. Estimated Blood Loss:     Estimated blood loss was minimal. Impression:               - Seven 3 to 5 mm polyps in the transverse colon                            and in the cecum, removed with a cold snare.                            Resected and retrieved.                           - Diverticulosis in the sigmoid colon and in  the                            descending colon.                           - Non-bleeding external and internal hemorrhoids. Recommendation:           - Resume previous diet.                           - Continue present medications.                           - Await pathology results.                           - No repeat colonoscopy due to age. Napoleon Form, MD 09/01/2022 9:44:16 AM This report has been signed electronically.

## 2022-09-01 NOTE — Progress Notes (Unsigned)
[facility] History and Physical   Primary Care Physician:  Roger Kill, PA-C   Reason for Procedure:  History of adenomatous colon polyps, family h/o colon cancer  Plan:    Surveillance colonoscopy with possible interventions as needed     HPI: Karen Fox is a very pleasant 77 y.o. female here for surveillance colonoscopy. Denies any nausea, vomiting, abdominal pain, melena or bright red blood per rectum  The risks and benefits as well as alternatives of endoscopic procedure(s) have been discussed and reviewed. All questions answered. The patient agrees to proceed.    Past Medical History:  Diagnosis Date   Allergy    Anemia    years ago before hysterectomy   Anxiety    little   Arthritis    feet   Blood transfusion without reported diagnosis    34 years ago   Cataract    GERD (gastroesophageal reflux disease)    mild   History of urticaria 08/23/2018   Hyperlipidemia    Hypertension    Osteopenia    uses vitamin D for this    Osteoporosis    osteopenia    Past Surgical History:  Procedure Laterality Date   CARDIAC CATHETERIZATION  12/11/2008   No intervention - continue medical therapy.   CARDIOVASCULAR STRESS TEST  01/26/2012   Mild ischemia in the distal LAD artery distribution.   COLONOSCOPY  2020   SHOULDER SURGERY  2021   SINOSCOPY     TRANSTHORACIC ECHOCARDIOGRAM  03/23/2008   EF 68%, no regional wall motion abnormalities noted, no significant valvular abnormalities.    Prior to Admission medications   Medication Sig Start Date End Date Taking? Authorizing Provider  ALPRAZolam Prudy Feeler) 0.5 MG tablet Take 0.5 mg by mouth at bedtime as needed for anxiety.   Yes [provider]  Butalbital-APAP-Caffeine (ZEBUTAL) 50-325-40 MG per capsule Take 1 capsule by mouth every 4 (four) hours as needed for pain.   Yes [provider]  Cholecalciferol (VITAMIN D3) 2000 UNITS capsule Take 5,000 Units by mouth daily.     Yes [provider]  citalopram (CELEXA) 20 MG tablet Take 1 tablet by mouth daily. 04/12/13  Yes [provider]  Cranberry 500 MG CAPS Take by mouth.   Yes [provider]  Cyanocobalamin (B-12) 5000 MCG CAPS Take by mouth.   Yes [provider]  fenofibrate 160 MG tablet Take 160 mg by mouth daily.   Yes [provider]  fluticasone (FLONASE) 50 MCG/ACT nasal spray Place 1-2 sprays into both nostrils daily. Reported on 06/06/2015 04/12/13  Yes [provider]  fosinopril (MONOPRIL) 10 MG tablet Take 20 mg by mouth daily.    Yes [provider]  ibuprofen (ADVIL) 100 MG/5ML suspension Take 200 mg by mouth every 4 (four) hours as needed.   Yes [provider]  omeprazole (PRILOSEC) 40 MG capsule omeprazole 40 mg capsule,delayed release 02/21/18  Yes [provider]  Probiotic Product (PROBIOTIC DAILY PO) Take by mouth.   Yes [provider]  Vitamin A 2400 MCG (8000 UT) TABS Take by mouth.   Yes [provider]  acetaminophen (TYLENOL) 325 MG tablet Take 650 mg by mouth every 6 (six) hours as needed.    [provider]  chlorpheniramine (CHLOR-TRIMETON) 4 MG tablet Take 4 mg by mouth 2 (two) times daily as needed for allergies. Patient not taking: Reported on 09/01/2022    [provider]  dimenhyDRINATE (DRAMAMINE) 50 MG tablet Take  50 mg by mouth every 8 (eight) hours as needed.    [provider]  levocetirizine (XYZAL) 5 MG tablet Take 1 tablet (5 mg total) by mouth daily as needed for allergies. Patient not taking: Reported on 08/10/2022 12/19/18   Bobbitt, Heywood Iles, MD    Current Outpatient Medications  Medication Sig Dispense Refill   ALPRAZolam (XANAX) 0.5 MG tablet Take 0.5 mg by mouth at bedtime as needed for anxiety.     Butalbital-APAP-Caffeine (ZEBUTAL) 50-325-40 MG per capsule Take 1 capsule by mouth every 4 (four) hours as needed for pain.     Cholecalciferol  (VITAMIN D3) 2000 UNITS capsule Take 5,000 Units by mouth daily.      citalopram (CELEXA) 20 MG tablet Take 1 tablet by mouth daily.     Cranberry 500 MG CAPS Take by mouth.     Cyanocobalamin (B-12) 5000 MCG CAPS Take by mouth.     fenofibrate 160 MG tablet Take 160 mg by mouth daily.     fluticasone (FLONASE) 50 MCG/ACT nasal spray Place 1-2 sprays into both nostrils daily. Reported on 06/06/2015     fosinopril (MONOPRIL) 10 MG tablet Take 20 mg by mouth daily.      ibuprofen (ADVIL) 100 MG/5ML suspension Take 200 mg by mouth every 4 (four) hours as needed.     omeprazole (PRILOSEC) 40 MG capsule omeprazole 40 mg capsule,delayed release     Probiotic Product (PROBIOTIC DAILY PO) Take by mouth.     Vitamin A 2400 MCG (8000 UT) TABS Take by mouth.     acetaminophen (TYLENOL) 325 MG tablet Take 650 mg by mouth every 6 (six) hours as needed.     chlorpheniramine (CHLOR-TRIMETON) 4 MG tablet Take 4 mg by mouth 2 (two) times daily as needed for allergies. (Patient not taking: Reported on 09/01/2022)     dimenhyDRINATE (DRAMAMINE) 50 MG tablet Take 50 mg by mouth every 8 (eight) hours as needed.     levocetirizine (XYZAL) 5 MG tablet Take 1 tablet (5 mg total) by mouth daily as needed for allergies. (Patient not taking: Reported on 08/10/2022) 30 tablet 0   Current Facility-Administered Medications  Medication Dose Route Frequency Provider Last Rate Last Admin   0.9 %  sodium chloride infusion  500 mL Intravenous Once Napoleon Form, MD        Allergies as of 09/01/2022 - Review Complete 09/01/2022  Allergen Reaction Noted   Amoxicillin Nausea Only 06/06/2015   Ciprofloxacin  07/11/2019   Contrast media [iodinated contrast media] Hives 04/12/2013   Doxycycline Other (See Comments) 10/30/2016   Erythromycin  02/06/2010   Metronidazole  07/11/2019   Propoxyphene Other (See Comments) 06/06/2015    Family History  Problem Relation Age of Onset   Kidney failure Mother    Cancer Father         Colon cancer   Stroke Father    Heart disease Father    Colon cancer Father    Mitral valve prolapse Sister    Heart attack Brother    Heart disease Brother    Colon polyps Brother    Heart attack Brother    Heart disease Brother    Hyperlipidemia Brother    Colon polyps Brother    Clotting disorder Brother    Colon polyps Brother    Heart attack Brother    Colon polyps Brother    Esophageal cancer Neg Hx    Rectal cancer Neg Hx    Stomach cancer Neg Hx  Social History   Socioeconomic History   Marital status: Married    Spouse name: Not on file   Number of children: Not on file   Years of education: Not on file   Highest education level: Not on file  Occupational History   Not on file  Tobacco Use   Smoking status: Never   Smokeless tobacco: Never  Vaping Use   Vaping status: Never Used  Substance and Sexual Activity   Alcohol use: No    Alcohol/week: 0.0 standard drinks of alcohol   Drug use: No   Sexual activity: Not on file  Other Topics Concern   Not on file  Social History Narrative   Not on file   Social Determinants of Health   Financial Resource Strain: Not on file  Food Insecurity: Not on file  Transportation Needs: Not on file  Physical Activity: Not on file  Stress: Not on file  Social Connections: Not on file  Intimate Partner Violence: Not on file    Review of Systems:  All other review of systems negative except as mentioned in the HPI.  Physical Exam: Vital signs in last 24 hours: BP (!) 152/95   Pulse 85   Temp 99.1 F (37.3 C) (Skin)   Ht 5\' 5"  (1.651 m)   Wt 161 lb (73 kg)   SpO2 97%   BMI 26.79 kg/m  General:   Alert, NAD Lungs:  Clear .   Heart:  Regular rate and rhythm Abdomen:  Soft, nontender and nondistended. Neuro/Psych:  Alert and cooperative. Normal mood and affect. A and O x 3  Reviewed labs, radiology imaging, old records and pertinent past GI work up  Patient is appropriate for planned procedure(s)  and anesthesia in an ambulatory setting   K. Scherry Ran , MD [phone]

## 2022-09-01 NOTE — Progress Notes (Unsigned)
Called to room to assist during endoscopic procedure.  Patient ID and intended procedure confirmed with present staff. Received instructions for my participation in the procedure from the performing physician.  

## 2022-09-01 NOTE — Patient Instructions (Signed)
YOU HAD AN ENDOSCOPIC PROCEDURE TODAY AT THE [facility]:   Refer to the procedure report that was given to you for any specific questions about what was found during the examination.  If the procedure report does not answer your questions, please call your gastroenterologist to clarify.  If you requested that your care partner not be given the details of your procedure findings, then the procedure report has been included in a sealed envelope for you to review at your convenience later.  YOU SHOULD EXPECT: Some feelings of bloating in the abdomen. Passage of more gas than usual.  Walking can help get rid of the air that was put into your GI tract during the procedure and reduce the bloating. If you had a lower endoscopy (such as a colonoscopy or flexible sigmoidoscopy) you may notice spotting of blood in your stool or on the toilet paper. If you underwent a bowel prep for your procedure, you may not have a normal bowel movement for a few days.  Please Note:  You might notice some irritation and congestion in your nose or some drainage.  This is from the oxygen used during your procedure.  There is no need for concern and it should clear up in a day or so.  SYMPTOMS TO REPORT IMMEDIATELY:  Following lower endoscopy (colonoscopy or flexible sigmoidoscopy):  Excessive amounts of blood in the stool  Significant tenderness or worsening of abdominal pains  Swelling of the abdomen that is new, acute  Fever of 100F or higher   For urgent or emergent issues, a gastroenterologist can be reached at any hour by calling [phone]. Do not use MyChart messaging for urgent concerns.    DIET:  We do recommend a small meal at first, but then you may proceed to your regular diet.  Drink plenty of fluids but you should avoid alcoholic beverages for 24 hours.  MEDICATIONS: Continue present medications.  Please see handouts given to you by your recovery nurse: Polyps, Diverticulosis,  Hemorrhoids.  FOLLOW UP: No repeat surveillance colonoscopy due to age.  Thank you for allowing [place] to provide for your healthcare needs today.  ACTIVITY:  You should plan to take it easy for the rest of today and you should NOT DRIVE or use heavy machinery until tomorrow (because of the sedation medicines used during the test).    FOLLOW UP: Our staff will call the number listed on your records the next business day following your procedure.  We will call around 7:15- 8:00 am to check on you and address any questions or concerns that you may have regarding the information given to you following your procedure. If we do not reach you, we will leave a message.     If any biopsies were taken you will be contacted by phone or by letter within the next 1-3 weeks.  Please call us at [phone] if you have not heard about the biopsies in 3 weeks.    SIGNATURES/CONFIDENTIALITY: You and/or your care partner have signed paperwork which will be entered into your electronic medical record.  These signatures attest to the fact that that the information above on your After Visit Summary has been reviewed and is understood.  Full responsibility of the confidentiality of this discharge information lies with you and/or your care-partner.

## 2022-09-01 NOTE — Progress Notes (Unsigned)
Uneventful anesthetic. Report to pacu rn. Vss. Care resumed by rn. 

## 2022-09-01 NOTE — Progress Notes (Signed)
Pt's states no medical or surgical changes since previsit or office visit. VS assessed by D.T 

## 2022-09-02 ENCOUNTER — Telehealth: Payer: Self-pay | Admitting: *Deleted

## 2022-09-02 NOTE — Telephone Encounter (Signed)
  Follow up Call-     09/01/2022    8:13 AM  Call back number  Post procedure Call Back phone  # [phone]  Permission to leave phone message Yes     Patient questions:  Do you have a fever, pain , or abdominal swelling? No. Pain Score  0 *  Have you tolerated food without any problems? Yes.    Have you been able to return to your normal activities? Yes.    Do you have any questions about your discharge instructions: Diet   No. Medications  No. Follow up visit  No.  Do you have questions or concerns about your Care? No.  Actions: * If pain score is 4 or above: No action needed, pain <4.

## 2022-09-18 ENCOUNTER — Encounter: Payer: Self-pay | Admitting: Gastroenterology

## 2022-12-04 NOTE — Progress Notes (Unsigned)
     12/04/2022 Karen Fox [id number] 13-Apr-1945   Chief Complaint: Abdominal pain  History of Present Illness: Karen Fox is a 77 year old female with a past medical history of anxiety, hypertension, hyperlipidemia, osteoporosis, GERD and colon polyps.  She is known by Dr. Lavon Paganini.  Labs in care everywhere 10/2022  Father with history of colon cancer.  Brother with history of colon polyps.    Past Medical History:  Diagnosis Date   Allergy    Anemia    years ago before hysterectomy   Anxiety    little   Arthritis    feet   Blood transfusion without reported diagnosis    34 years ago   Cataract    GERD (gastroesophageal reflux disease)    mild   History of urticaria 08/23/2018   Hyperlipidemia    Hypertension    Osteopenia    uses vitamin D for this    Osteoporosis    osteopenia   Past Surgical History:  Procedure Laterality Date   CARDIAC CATHETERIZATION  12/11/2008   No intervention - continue medical therapy.   CARDIOVASCULAR STRESS TEST  01/26/2012   Mild ischemia in the distal LAD artery distribution.   COLONOSCOPY  2020   COLONOSCOPY W/ POLYPECTOMY  09/01/2022   [facility]ena Nandigam at [facility]   SHOULDER SURGERY  2021   SINOSCOPY     TRANSTHORACIC ECHOCARDIOGRAM  03/23/2008   EF 68%, no regional wall motion abnormalities noted, no significant valvular abnormalities.     Colonoscopy 09/01/2022 by Dr. Lavon Paganini: - Seven 3 to 5 mm polyps in the transverse colon and in the cecum, removed with a cold snare. Resected and retrieved.  - Diverticulosis in the sigmoid colon and in the descending colon.  - Non-bleeding external and internal hemorrhoids. -No further colon polyp surveillance colonoscopies recommended due to age Surgical [P], colon, transverse and cecum, polyp (7) TUBULAR ADENOMA (S) WITHOUT HIGH GRADE DYSPLASIA  Current Medications, Allergies, Past Medical History, Past Surgical History, Family History and Social History were reviewed in  [employer] record.   Review of Systems:   Constitutional: Negative for fever, sweats, chills or weight loss.  Respiratory: Negative for shortness of breath.   Cardiovascular: Negative for chest pain, palpitations and leg swelling.  Gastrointestinal: See HPI.  Musculoskeletal: Negative for back pain or muscle aches.  Neurological: Negative for dizziness, headaches or paresthesias.    Physical Exam: There were no vitals taken for this visit. General: in no acute distress. Head: Normocephalic and atraumatic. Eyes: No scleral icterus. Conjunctiva pink . Ears: Normal auditory acuity. Mouth: Dentition intact. No ulcers or lesions.  Lungs: Clear throughout to auscultation. Heart: Regular rate and rhythm, no murmur. Abdomen: Soft, nontender and nondistended. No masses or hepatomegaly. Normal bowel sounds x 4 quadrants.  Rectal: *** Musculoskeletal: Symmetrical with no gross deformities. Extremities: No edema. Neurological: Alert oriented x 4. No focal deficits.  Psychological: Alert and cooperative. Normal mood and affect  Assessment and Recommendations: ***

## 2022-12-07 ENCOUNTER — Ambulatory Visit (INDEPENDENT_AMBULATORY_CARE_PROVIDER_SITE_OTHER): Payer: Medicare HMO | Admitting: Nurse Practitioner

## 2022-12-07 ENCOUNTER — Encounter: Payer: Self-pay | Admitting: Nurse Practitioner

## 2022-12-07 VITALS — BP 122/70 | HR 99 | Ht 65.0 in | Wt 156.0 lb

## 2022-12-07 DIAGNOSIS — R110 Nausea: Secondary | ICD-10-CM | POA: Diagnosis not present

## 2022-12-07 DIAGNOSIS — R1013 Epigastric pain: Secondary | ICD-10-CM | POA: Diagnosis not present

## 2022-12-07 DIAGNOSIS — K219 Gastro-esophageal reflux disease without esophagitis: Secondary | ICD-10-CM | POA: Diagnosis not present

## 2022-12-07 DIAGNOSIS — R140 Abdominal distension (gaseous): Secondary | ICD-10-CM | POA: Diagnosis not present

## 2022-12-07 MED ORDER — PANTOPRAZOLE SODIUM 40 MG PO TBEC: 40.0000 mg | DELAYED_RELEASE_TABLET | Freq: Every day | ORAL | 1 refills | Status: DC

## 2022-12-07 MED ORDER — FAMOTIDINE 20 MG PO TABS: 20.0000 mg | ORAL_TABLET | Freq: Every day | ORAL | 1 refills | Status: DC

## 2022-12-07 NOTE — Patient Instructions (Addendum)
Stop Omeprazole.  Your provider has ordered "Diatherix" stool testing for you. You have received a kit from our office today containing all necessary supplies to complete this test. Please carefully read the stool collection instructions provided in the kit before opening the accompanying materials. In addition, be sure to place the label from the top right corner of the laboratory request sheet onto the "puritan opti-swab" tube that is supplied in the kit. This label should include your full name and date of birth. After completing the test, you should secure the purtian tube into the specimen biohazard bag. The laboratory request information sheet (including date and time of specimen collection) should be placed into the outside pocket of the specimen biohazard bag and returned to the [facility] lab with 2 days of collection.   If the laboratory information sheet specimen date and time are not filled out, the test will NOT be performed.  Due to recent changes in healthcare laws, you may see the results of your imaging and laboratory studies on MyChart before your provider has had a chance to review them.  We understand that in some cases there may be results that are confusing or concerning to you. Not all laboratory results come back in the same time frame and the provider may be waiting for multiple results in order to interpret others.  Please give [place] 48 hours in order for your provider to thoroughly review all the results before contacting the office for clarification of your results.   Thank you for trusting me with your gastrointestinal care!   Alcide Evener, CRNP

## 2022-12-14 ENCOUNTER — Telehealth: Payer: Self-pay

## 2022-12-14 NOTE — Telephone Encounter (Signed)
Contacted pt and pt verbalized understanding of negative Diatherix h pylori stool test.

## 2022-12-23 ENCOUNTER — Encounter: Payer: Self-pay | Admitting: Nurse Practitioner

## 2022-12-29 ENCOUNTER — Other Ambulatory Visit: Payer: Self-pay | Admitting: Nurse Practitioner

## 2023-01-20 ENCOUNTER — Telehealth: Payer: Self-pay | Admitting: Nurse Practitioner

## 2023-01-20 NOTE — Telephone Encounter (Signed)
 Pt stated that she is continuing to have the acid reflux with the chest pain, bloating and losing weight. Pt requesting to have EGD done. Chart reviewed and noted that pt had an office visit on 12/07/2022 with Elida Nyle Sharps NP Please review and advise if OK to proceed with scheduling pt for the EGD

## 2023-01-20 NOTE — Telephone Encounter (Signed)
 Inbound call frrom patient, would like to discuss scheduling endoscopy. Patient states her symptoms have not improved. She states she is continuously losing weight and would like to discuss further options.

## 2023-01-21 ENCOUNTER — Other Ambulatory Visit: Payer: Self-pay

## 2023-01-21 DIAGNOSIS — R1013 Epigastric pain: Secondary | ICD-10-CM

## 2023-01-21 DIAGNOSIS — R110 Nausea: Secondary | ICD-10-CM

## 2023-01-21 DIAGNOSIS — K219 Gastro-esophageal reflux disease without esophagitis: Secondary | ICD-10-CM

## 2023-01-21 DIAGNOSIS — R140 Abdominal distension (gaseous): Secondary | ICD-10-CM

## 2023-01-21 NOTE — Telephone Encounter (Signed)
 Karen Fox, if patient is experiencing any chest pain unrelated to eating or time of active reflux she will need to see cardiology and cardiac clearance would then be required. If she is having acid reflux with associated esophageal pain then ok to schedule EGD with Dr. Shila. Pls also send her for a RUQ sonogram to evaluate the gallbladder and to our lab to check a CBC and CMP. Thanks.

## 2023-01-21 NOTE — Telephone Encounter (Signed)
 Agree, thanks

## 2023-01-21 NOTE — Telephone Encounter (Signed)
 Patient made aware of NP/MD recommendations. She said the chest pain is only occurring after meals/associated with the epigastric pain she is having. EGD scheduled for 02/05/23 at 10:00 am in the [facility] with Dr. Shila. Amb ref placed & instructions sent to patient. Denies any blood thinners/diabetic medications. RUQ US  scheduled for 01/29/23 at 10:30 am, arrive by 10:15 am at [facility]. NPO starting at midnight. Labs ordered. Pt has been advised on when/where to go for appointments/labs, and verbalized all understanding. No further questions.

## 2023-01-27 ENCOUNTER — Other Ambulatory Visit: Payer: Medicare HMO

## 2023-01-27 DIAGNOSIS — R1013 Epigastric pain: Secondary | ICD-10-CM

## 2023-01-27 DIAGNOSIS — K219 Gastro-esophageal reflux disease without esophagitis: Secondary | ICD-10-CM

## 2023-01-27 DIAGNOSIS — R110 Nausea: Secondary | ICD-10-CM

## 2023-01-27 DIAGNOSIS — R140 Abdominal distension (gaseous): Secondary | ICD-10-CM

## 2023-01-27 LAB — COMPREHENSIVE METABOLIC PANEL
ALT: 11 U/L (ref 0–35)
AST: 21 U/L (ref 0–37)
Albumin: 4.6 g/dL (ref 3.5–5.2)
Alkaline Phosphatase: 62 U/L (ref 39–117)
BUN: 12 mg/dL (ref 6–23)
CO2: 29 meq/L (ref 19–32)
Calcium: 9.3 mg/dL (ref 8.4–10.5)
Chloride: 104 meq/L (ref 96–112)
Creatinine, Ser: 0.93 mg/dL (ref 0.40–1.20)
GFR: 59.55 mL/min — ABNORMAL LOW (ref >60.00)
Glucose, Bld: 129 mg/dL — ABNORMAL HIGH (ref 70–99)
Potassium: 3.5 meq/L (ref 3.5–5.1)
Sodium: 142 meq/L (ref 135–145)
Total Bilirubin: 0.5 mg/dL (ref 0.2–1.2)
Total Protein: 7.3 g/dL (ref 6.0–8.3)

## 2023-01-27 LAB — CBC
HCT: 41.1 % (ref 36.0–46.0)
Hemoglobin: 13.5 g/dL (ref 12.0–15.0)
MCHC: 32.9 g/dL (ref 30.0–36.0)
MCV: 91.6 fL (ref 78.0–100.0)
Platelets: 337 10*3/uL (ref 150.0–400.0)
RBC: 4.48 Mil/uL (ref 3.87–5.11)
RDW: 13.7 % (ref 11.5–15.5)
WBC: 6.9 10*3/uL (ref 4.0–10.5)

## 2023-01-29 ENCOUNTER — Ambulatory Visit (HOSPITAL_COMMUNITY)
Admission: RE | Admit: 2023-01-29 | Discharge: 2023-01-29 | Disposition: A | Discharge status: Home / Self Care | Payer: Medicare HMO | Source: Ambulatory Visit | Attending: Nurse Practitioner | Admitting: Nurse Practitioner

## 2023-01-29 DIAGNOSIS — R1013 Epigastric pain: Secondary | ICD-10-CM | POA: Diagnosis present

## 2023-01-29 DIAGNOSIS — K219 Gastro-esophageal reflux disease without esophagitis: Secondary | ICD-10-CM | POA: Diagnosis present

## 2023-01-29 DIAGNOSIS — R110 Nausea: Secondary | ICD-10-CM | POA: Diagnosis present

## 2023-01-29 DIAGNOSIS — R140 Abdominal distension (gaseous): Secondary | ICD-10-CM | POA: Diagnosis present

## 2023-02-05 ENCOUNTER — Ambulatory Visit (AMBULATORY_SURGERY_CENTER): Payer: Medicare HMO | Admitting: Gastroenterology

## 2023-02-05 ENCOUNTER — Encounter: Payer: Self-pay | Admitting: Gastroenterology

## 2023-02-05 VITALS — BP 128/74 | HR 2 | Temp 97.5°F | Resp 17 | Ht 65.0 in | Wt 156.0 lb

## 2023-02-05 DIAGNOSIS — R1310 Dysphagia, unspecified: Secondary | ICD-10-CM | POA: Diagnosis not present

## 2023-02-05 DIAGNOSIS — R0789 Other chest pain: Secondary | ICD-10-CM | POA: Diagnosis not present

## 2023-02-05 DIAGNOSIS — K2100 Gastro-esophageal reflux disease with esophagitis, without bleeding: Secondary | ICD-10-CM

## 2023-02-05 DIAGNOSIS — R110 Nausea: Secondary | ICD-10-CM

## 2023-02-05 MED ORDER — SODIUM CHLORIDE 0.9 % IV SOLN: 500.0000 mL | Freq: Once | INTRAVENOUS | Status: DC

## 2023-02-05 MED ORDER — SUCRALFATE 1 GM/10ML PO SUSP: 1.0000 g | Freq: Two times a day (BID) | ORAL | 1 refills | Status: AC | PRN

## 2023-02-05 NOTE — Progress Notes (Signed)
[facility] History and Physical   Primary Care Physician:  Odis Luster, PA-C   Reason for Procedure:  Nausea, atypical chest pain, GERD  Plan:    EGD  with possible interventions as needed     HPI: Karen Fox is a very pleasant 78 y.o. female here for EGD for evaluation of Nausea, atypical chest pain, GERD. Please refer to office visit note by Karen Fox 12/07/2022 for additional details  The risks and benefits as well as alternatives of endoscopic procedure(s) have been discussed and reviewed. All questions answered. The patient agrees to proceed.    Past Medical History:  Diagnosis Date   Allergy    Anemia    years ago before hysterectomy   Anxiety    little   Arthritis    feet   Blood transfusion without reported diagnosis    34 years ago   Cataract    GERD (gastroesophageal reflux disease)    mild   History of urticaria 08/23/2018   Hyperlipidemia    Hypertension    Osteopenia    uses vitamin D for this    Osteoporosis    osteopenia    Past Surgical History:  Procedure Laterality Date   CARDIAC CATHETERIZATION  12/11/2008   No intervention - continue medical therapy.   CARDIOVASCULAR STRESS TEST  01/26/2012   Mild ischemia in the distal LAD artery distribution.   COLONOSCOPY  2020   COLONOSCOPY W/ POLYPECTOMY  09/01/2022   Karen Fox at [facility]   SHOULDER SURGERY  2021   SINOSCOPY     TRANSTHORACIC ECHOCARDIOGRAM  03/23/2008   EF 68%, no regional wall motion abnormalities noted, no significant valvular abnormalities.    Prior to Admission medications   Medication Sig Start Date End Date Taking? Authorizing Provider  acetaminophen (TYLENOL) 325 MG tablet Take 650 mg by mouth every 6 (six) hours as needed.   Yes [provider]  ALPRAZolam Prudy Feeler) 0.5 MG tablet Take 0.5 mg by mouth at bedtime as needed for anxiety. Pt taking .25 mg as needed   Yes [provider]  Butalbital-APAP-Caffeine (ZEBUTAL) 50-325-40 MG  per capsule Take 1 capsule by mouth every 4 (four) hours as needed for pain.   Yes [provider]  chlorpheniramine (CHLOR-TRIMETON) 4 MG tablet Take 4 mg by mouth 2 (two) times daily as needed for allergies.   Yes [provider]  citalopram (CELEXA) 20 MG tablet Take 1 tablet by mouth daily. 04/12/13  Yes [provider]  Cyanocobalamin (B-12) 5000 MCG CAPS Take by mouth.   Yes [provider]  dimenhyDRINATE (DRAMAMINE) 50 MG tablet Take 50 mg by mouth every 8 (eight) hours as needed.   Yes [provider]  fenofibrate 160 MG tablet Take 160 mg by mouth daily.   Yes [provider]  fosinopril (MONOPRIL) 10 MG tablet Take 20 mg by mouth daily.    Yes [provider]  ibuprofen (ADVIL) 100 MG/5ML suspension Take 200 mg by mouth every 4 (four) hours as needed.   Yes [provider]  pantoprazole (PROTONIX) 40 MG tablet Take 1 tablet (40 mg total) by mouth daily. 12/07/22  Yes Arnaldo Natal, NP  Probiotic Product (PROBIOTIC DAILY PO) Take by mouth.   Yes [provider]  Vitamin A 2400 MCG (8000 UT) TABS Take by mouth.   Yes [provider]  Cholecalciferol (VITAMIN D3) 2000 UNITS capsule Take 5,000 Units by mouth daily.  Patient not taking: Reported on 02/05/2023  [provider]  Cranberry 500 MG CAPS Take by mouth. Patient not taking: Reported on 02/05/2023    [provider]  famotidine (PEPCID) 20 MG tablet TAKE 1 TABLET BY MOUTH EVERYDAY AT BEDTIME Patient not taking: Reported on 02/05/2023 12/29/22   Arnaldo Natal, NP  Fish Oil-Cholecalciferol (FISH OIL + D3 PO) Take by mouth.    [provider]  fluticasone (FLONASE) 50 MCG/ACT nasal spray Place 1-2 sprays into both nostrils daily. Reported on 06/06/2015 04/12/13   [provider]  levocetirizine (XYZAL) 5 MG tablet Take 1 tablet (5 mg total) by mouth daily as needed for allergies. Patient not  taking: Reported on 02/05/2023 12/19/18   Bobbitt, Heywood Iles, MD  omeprazole (PRILOSEC) 40 MG capsule omeprazole 40 mg capsule,delayed release Patient not taking: Reported on 02/05/2023 02/21/18   [provider]    Current Outpatient Medications  Medication Sig Dispense Refill   acetaminophen (TYLENOL) 325 MG tablet Take 650 mg by mouth every 6 (six) hours as needed.     ALPRAZolam (XANAX) 0.5 MG tablet Take 0.5 mg by mouth at bedtime as needed for anxiety. Pt taking .25 mg as needed     Butalbital-APAP-Caffeine (ZEBUTAL) 50-325-40 MG per capsule Take 1 capsule by mouth every 4 (four) hours as needed for pain.     chlorpheniramine (CHLOR-TRIMETON) 4 MG tablet Take 4 mg by mouth 2 (two) times daily as needed for allergies.     citalopram (CELEXA) 20 MG tablet Take 1 tablet by mouth daily.     Cyanocobalamin (B-12) 5000 MCG CAPS Take by mouth.     dimenhyDRINATE (DRAMAMINE) 50 MG tablet Take 50 mg by mouth every 8 (eight) hours as needed.     fenofibrate 160 MG tablet Take 160 mg by mouth daily.     fosinopril (MONOPRIL) 10 MG tablet Take 20 mg by mouth daily.      ibuprofen (ADVIL) 100 MG/5ML suspension Take 200 mg by mouth every 4 (four) hours as needed.     pantoprazole (PROTONIX) 40 MG tablet Take 1 tablet (40 mg total) by mouth daily. 90 tablet 1   Probiotic Product (PROBIOTIC DAILY PO) Take by mouth.     Vitamin A 2400 MCG (8000 UT) TABS Take by mouth.     Cholecalciferol (VITAMIN D3) 2000 UNITS capsule Take 5,000 Units by mouth daily.  (Patient not taking: Reported on 02/05/2023)     Cranberry 500 MG CAPS Take by mouth. (Patient not taking: Reported on 02/05/2023)     famotidine (PEPCID) 20 MG tablet TAKE 1 TABLET BY MOUTH EVERYDAY AT BEDTIME (Patient not taking: Reported on 02/05/2023) 90 tablet 1   Fish Oil-Cholecalciferol (FISH OIL + D3 PO) Take by mouth.     fluticasone (FLONASE) 50 MCG/ACT nasal spray Place 1-2 sprays into both nostrils daily. Reported on 06/06/2015      levocetirizine (XYZAL) 5 MG tablet Take 1 tablet (5 mg total) by mouth daily as needed for allergies. (Patient not taking: Reported on 02/05/2023) 30 tablet 0   omeprazole (PRILOSEC) 40 MG capsule omeprazole 40 mg capsule,delayed release (Patient not taking: Reported on 02/05/2023)     Current Facility-Administered Medications  Medication Dose Route Frequency Provider Last Rate Last Admin   0.9 %  sodium chloride infusion  500 mL Intravenous Once Napoleon Form, MD        Allergies as of 02/05/2023 - Review Complete 02/05/2023  Allergen Reaction Noted   Amoxicillin Nausea Only 06/06/2015   Ciprofloxacin Nausea And  Vomiting 07/11/2019   Contrast media [iodinated contrast media] Hives 04/12/2013   Doxycycline Nausea And Vomiting 10/30/2016   Erythromycin  02/06/2010   Metronidazole Other (See Comments) 07/11/2019   Propoxyphene Other (See Comments) 06/06/2015    Family History  Problem Relation Age of Onset   Kidney failure Mother    Cancer Father        Colon cancer   Stroke Father    Heart disease Father    Colon cancer Father    Mitral valve prolapse Sister    Heart attack Brother    Heart disease Brother    Colon polyps Brother    Heart attack Brother    Heart disease Brother    Hyperlipidemia Brother    Colon polyps Brother    Clotting disorder Brother    Colon polyps Brother    Heart attack Brother    Colon polyps Brother    Esophageal cancer Neg Hx    Rectal cancer Neg Hx    Stomach cancer Neg Hx     Social History   Socioeconomic History   Marital status: Widowed    Spouse name: Not on file   Number of children: Not on file   Years of education: Not on file   Highest education level: Not on file  Occupational History   Not on file  Tobacco Use   Smoking status: Never   Smokeless tobacco: Never  Vaping Use   Vaping status: Never Used  Substance and Sexual Activity   Alcohol use: No    Alcohol/week: 0.0 standard drinks of alcohol   Drug use: No    Sexual activity: Not on file  Other Topics Concern   Not on file  Social History Narrative   Not on file   Social Drivers of Health   Financial Resource Strain: Not on file  Food Insecurity: Not on file  Transportation Needs: Not on file  Physical Activity: Not on file  Stress: Not on file  Social Connections: Not on file  Intimate Partner Violence: Not on file    Review of Systems:  All other review of systems negative except as mentioned in the HPI.  Physical Exam: Vital signs in last 24 hours: BP (!) 158/73   Pulse 81   Temp (!) 97.5 F (36.4 C)   Ht 5\' 5"  (1.651 m)   Wt 156 lb (70.8 kg)   SpO2 100%   BMI 25.96 kg/m  General:   Alert, NAD Lungs:  Clear .   Heart:  Regular rate and rhythm Abdomen:  Soft, nontender and nondistended. Neuro/Psych:  Alert and cooperative. Normal mood and affect. A and O x 3  Reviewed labs, radiology imaging, old records and pertinent past GI work up  Patient is appropriate for planned procedure(s) and anesthesia in an ambulatory setting   K. Scherry Ran , MD [phone]

## 2023-02-05 NOTE — Progress Notes (Signed)
Called to room to assist during endoscopic procedure.  Patient ID and intended procedure confirmed with present staff. Received instructions for my participation in the procedure from the performing physician.

## 2023-02-05 NOTE — Op Note (Signed)
[facility] Patient Name: Karen Fox Procedure Date: 02/05/2023 10:21 AM MRN: [id number] Endoscopist: Napoleon Form , MD, [id number] Age: 78 Referring MD:  Date of Birth: [birth year] Gender: Female Account #: [id number] Procedure:                Upper GI endoscopy Indications:              Dysphagia, Esophageal reflux symptoms that persist                            despite appropriate therapy, Chest pain (non                            cardiac), Nausea Medicines:                Monitored Anesthesia Care Procedure:                Pre-Anesthesia Assessment:                           - Prior to the procedure, a History and Physical                            was performed, and patient medications and                            allergies were reviewed. The patient's tolerance of                            previous anesthesia was also reviewed. The risks                            and benefits of the procedure and the sedation                            options and risks were discussed with the patient.                            All questions were answered, and informed consent                            was obtained. Prior Anticoagulants: The patient has                            taken no anticoagulant or antiplatelet agents. ASA                            Grade Assessment: II - A patient with mild systemic                            disease. After reviewing the risks and benefits,                            the patient was deemed in satisfactory condition to  undergo the procedure.                           After obtaining informed consent, the endoscope was                            passed under direct vision. Throughout the                            procedure, the patient's blood pressure, pulse, and                            oxygen saturations were monitored continuously. The                            GIF [id number] #[id number] was  introduced through the                            mouth, and advanced to the second part of duodenum.                            The upper GI endoscopy was accomplished without                            difficulty. The patient tolerated the procedure                            well. Scope In: Scope Out: Findings:                 The Z-line was regular and was found 38 cm from the                            incisors.                           No endoscopic abnormality was evident in the                            esophagus to explain the patient's complaint of                            dysphagia. It was decided, however, to proceed with                            dilation of the entire esophagus. The scope was                            withdrawn. Dilation was performed with a Maloney                            dilator with no resistance at 50 Fr. The dilation                            site was examined following  endoscope reinsertion                            and showed no change.                           A 2 cm hiatal hernia was present.                           The stomach was normal.                           The cardia and gastric fundus were normal on                            retroflexion.                           The examined duodenum was normal. Complications:            No immediate complications. Estimated Blood Loss:     Estimated blood loss was minimal. Impression:               - Z-line regular, 38 cm from the incisors.                           - No endoscopic esophageal abnormality to explain                            patient's dysphagia. Esophagus dilated. Dilated.                           - 2 cm hiatal hernia.                           - Normal stomach.                           - Normal examined duodenum.                           - No specimens collected. Recommendation:           - Resume previous diet.                           - Continue present  medications.                           - Follow an antireflux regimen.                           - Use sucralfate suspension 1 gram PO BID PRN. Rx                            for 30 days with 1 refill                           - Follow up in  GI office in 6-8 weeks Napoleon Form, MD 02/05/2023 10:57:24 AM This report has been signed electronically.

## 2023-02-05 NOTE — Patient Instructions (Signed)
Discharge instructions given. Handouts on Dilatation diet and Hiatal Hernia. Prescription sent to pharmacy. Resume previous medications. YOU HAD AN ENDOSCOPIC PROCEDURE TODAY AT THE  ENDOSCOPY CENTER:   Refer to the procedure report that was given to you for any specific questions about what was found during the examination.  If the procedure report does not answer your questions, please call your gastroenterologist to clarify.  If you requested that your care partner not be given the details of your procedure findings, then the procedure report has been included in a sealed envelope for you to review at your convenience later.  YOU SHOULD EXPECT: Some feelings of bloating in the abdomen. Passage of more gas than usual.  Walking can help get rid of the air that was put into your GI tract during the procedure and reduce the bloating. If you had a lower endoscopy (such as a colonoscopy or flexible sigmoidoscopy) you may notice spotting of blood in your stool or on the toilet paper. If you underwent a bowel prep for your procedure, you may not have a normal bowel movement for a few days.  Please Note:  You might notice some irritation and congestion in your nose or some drainage.  This is from the oxygen used during your procedure.  There is no need for concern and it should clear up in a day or so.  SYMPTOMS TO REPORT IMMEDIATELY:   Following upper endoscopy (EGD)  Vomiting of blood or coffee ground material  New chest pain or pain under the shoulder blades  Painful or persistently difficult swallowing  New shortness of breath  Fever of 100F or higher  Black, tarry-looking stools  For urgent or emergent issues, a gastroenterologist can be reached at any hour by calling [phone]. Do not use MyChart messaging for urgent concerns.    DIET:  We do recommend a small meal at first, but then you may proceed to your regular diet.  Drink plenty of fluids but you should avoid alcoholic  beverages for 24 hours.  ACTIVITY:  You should plan to take it easy for the rest of today and you should NOT DRIVE or use heavy machinery until tomorrow (because of the sedation medicines used during the test).    FOLLOW UP: Our staff will call the number listed on your records the next business day following your procedure.  We will call around 7:15- 8:00 am to check on you and address any questions or concerns that you may have regarding the information given to you following your procedure. If we do not reach you, we will leave a message.     If any biopsies were taken you will be contacted by phone or by letter within the next 1-3 weeks.  Please call us at [phone] if you have not heard about the biopsies in 3 weeks.    SIGNATURES/CONFIDENTIALITY: You and/or your care partner have signed paperwork which will be entered into your electronic medical record.  These signatures attest to the fact that that the information above on your After Visit Summary has been reviewed and is understood.  Full responsibility of the confidentiality of this discharge information lies with you and/or your care-partner.

## 2023-02-05 NOTE — Progress Notes (Signed)
Sedate, gd SR, tolerated procedure well, VSS, report to RN

## 2023-02-08 ENCOUNTER — Telehealth: Payer: Self-pay

## 2023-02-08 NOTE — Telephone Encounter (Signed)
  Follow up Call-     02/05/2023    9:31 AM 09/01/2022    8:13 AM  Call back number  Post procedure Call Back phone  # [phone]  Permission to leave phone message Yes Yes     Patient questions:  Do you have a fever, pain , or abdominal swelling? No. Pain Score  0 *  Have you tolerated food without any problems? Yes.    Have you been able to return to your normal activities? Yes.    Do you have any questions about your discharge instructions: Diet   No. Medications  No. Follow up visit  No.  Do you have questions or concerns about your Care? No.  Actions: * If pain score is 4 or above: No action needed, pain <4.

## 2023-04-21 ENCOUNTER — Encounter: Payer: Self-pay | Admitting: Gastroenterology

## 2023-04-21 ENCOUNTER — Ambulatory Visit: Payer: Medicare HMO | Admitting: Gastroenterology

## 2023-04-21 VITALS — BP 130/82 | HR 82 | Ht 65.0 in | Wt 151.6 lb

## 2023-04-21 DIAGNOSIS — R197 Diarrhea, unspecified: Secondary | ICD-10-CM

## 2023-04-21 DIAGNOSIS — R110 Nausea: Secondary | ICD-10-CM

## 2023-04-21 DIAGNOSIS — K219 Gastro-esophageal reflux disease without esophagitis: Secondary | ICD-10-CM

## 2023-04-21 DIAGNOSIS — K2100 Gastro-esophageal reflux disease with esophagitis, without bleeding: Secondary | ICD-10-CM

## 2023-04-21 MED ORDER — PANTOPRAZOLE SODIUM 20 MG PO TBEC: 20.0000 mg | DELAYED_RELEASE_TABLET | Freq: Every day | ORAL | 3 refills | Status: AC

## 2023-04-21 NOTE — Progress Notes (Signed)
.  vno

## 2023-04-21 NOTE — Progress Notes (Signed)
 Karen Fox    [id number]    09/04/1945  Primary Care Physician:James, Grier Leber, PA-C  Referring Physician: Margaret Sharp, PA-C [address]  [place] [address]   Chief complaint:  Nausea, GERD  Discussed the use of AI scribe software for clinical note transcription with the patient, who gave verbal consent to proceed.  History of Present Illness Karen Fox is a 78 year old female who presents for follow-up regarding nausea management.  She has experienced a significant improvement in her nausea symptoms, which previously occurred daily and now occur less than once a month. This improvement is attributed to her current medication regimen, which includes Protonix  (pantoprazole ) taken daily in the morning and Pepcid  (famotidine ) used as needed. She has discontinued sucralfate  (Carafate ) due to throat irritation and a sensation of coating in her throat.  No trouble swallowing, stomach pains, changes in bowel habits, constipation, diarrhea, blood in stool, or black stool. Appetite and weight are stable. Occasionally, she experiences diarrhea attributed to certain foods, resolving quickly without causing discomfort.  She has quit smoking, which may have contributed to her improvement.    [facility]Q ultrasound 01/29/23 1. No acute abnormality identified. 2. Increased echotexture of the liver. This is a nonspecific finding but can be seen in fatty infiltration of liver.  EGD1/24/25 - Z- line regular, 38 cm from the incisors. - No endoscopic esophageal abnormality to explain patient' s dysphagia. Esophagus dilated. Dilated. - 2 cm hiatal hernia. - Normal stomach. - Normal examined duodenum.  Colonoscopy 09/01/22 - Seven 3 to 5 mm polyps in the transverse colon and in the cecum, removed with a cold snare. Resected and retrieved. - Diverticulosis in the sigmoid colon and in the descending colon. - Non- bleeding external and internal hemorrhoids.    Outpatient  Encounter Medications as of 04/21/2023  Medication Sig   acetaminophen (TYLENOL) 325 MG tablet Take 650 mg by mouth every 6 (six) hours as needed.   ALPRAZolam (XANAX) 0.5 MG tablet Take 0.5 mg by mouth at bedtime as needed for anxiety. Pt taking .25 mg as needed   Butalbital-APAP-Caffeine (ZEBUTAL) 50-325-40 MG per capsule Take 1 capsule by mouth every 4 (four) hours as needed for pain.   chlorpheniramine (CHLOR-TRIMETON) 4 MG tablet Take 4 mg by mouth 2 (two) times daily as needed for allergies.   citalopram (CELEXA) 20 MG tablet Take 1 tablet by mouth daily.   Cranberry 500 MG CAPS Take by mouth.   Cyanocobalamin (B-12) 5000 MCG CAPS Take by mouth.   dimenhyDRINATE (DRAMAMINE) 50 MG tablet Take 50 mg by mouth every 8 (eight) hours as needed.   famotidine  (PEPCID ) 20 MG tablet TAKE 1 TABLET BY MOUTH EVERYDAY AT BEDTIME   fenofibrate 160 MG tablet Take 160 mg by mouth daily.   Fish Oil-Cholecalciferol (FISH OIL + D3 PO) Take by mouth.   fluticasone (FLONASE) 50 MCG/ACT nasal spray Place 1-2 sprays into both nostrils daily. Reported on 06/06/2015   fosinopril (MONOPRIL) 10 MG tablet Take 20 mg by mouth daily.    ibuprofen (ADVIL) 100 MG/5ML suspension Take 200 mg by mouth every 4 (four) hours as needed.   pantoprazole  (PROTONIX ) 20 MG tablet Take 1 tablet (20 mg total) by mouth daily.   Probiotic Product (PROBIOTIC DAILY PO) Take by mouth.   sucralfate  (CARAFATE ) 1 GM/10ML suspension Take 10 mLs (1 g total) by mouth 2 (two) times  daily as needed.   Vitamin A 2400 MCG (8000 UT) TABS Take by mouth.   [DISCONTINUED] pantoprazole  (PROTONIX ) 40 MG tablet Take 1 tablet (40 mg total) by mouth daily.   [DISCONTINUED] Cholecalciferol (VITAMIN D3) 2000 UNITS capsule Take 5,000 Units by mouth daily.  (Patient not taking: Reported on 02/05/2023)   [DISCONTINUED] levocetirizine (XYZAL ) 5 MG tablet Take 1 tablet (5 mg total) by mouth daily as needed for allergies. (Patient not taking: Reported on 02/05/2023)    [DISCONTINUED] omeprazole (PRILOSEC) 40 MG capsule omeprazole 40 mg capsule,delayed release (Patient not taking: Reported on 02/05/2023)   No facility-administered encounter medications on file as of 04/21/2023.    Allergies as of 04/21/2023 - Review Complete 04/21/2023  Allergen Reaction Noted   Amoxicillin Nausea Only 06/06/2015   Ciprofloxacin  Nausea And Vomiting 07/11/2019   Contrast media [iodinated contrast media] Hives 04/12/2013   Doxycycline Nausea And Vomiting 10/30/2016   Erythromycin  02/06/2010   Metronidazole  Other (See Comments) 07/11/2019   Propoxyphene Other (See Comments) 06/06/2015    Past Medical History:  Diagnosis Date   Allergy     Anemia    years ago before hysterectomy   Anxiety    little   Arthritis    feet   Blood transfusion without reported diagnosis    34 years ago   Cataract    GERD (gastroesophageal reflux disease)    mild   History of urticaria 08/23/2018   Hyperlipidemia    Hypertension    Osteopenia    uses vitamin D for this    Osteoporosis    osteopenia    Past Surgical History:  Procedure Laterality Date   CARDIAC CATHETERIZATION  12/11/2008   No intervention - continue medical therapy.   CARDIOVASCULAR STRESS TEST  01/26/2012   Mild ischemia in the distal LAD artery distribution.   COLONOSCOPY  2020   COLONOSCOPY W/ POLYPECTOMY  09/01/2022   Veena Tyrrell Stephens at [facility]   SHOULDER SURGERY  2021   SINOSCOPY     TRANSTHORACIC ECHOCARDIOGRAM  03/23/2008   EF 68%, no regional wall motion abnormalities noted, no significant valvular abnormalities.    Family History  Problem Relation Age of Onset   Kidney failure Mother    Cancer Father        Colon cancer   Stroke Father    Heart disease Father    Colon cancer Father    Mitral valve prolapse Sister    Heart attack Brother    Heart disease Brother    Colon polyps Brother    Heart attack Brother    Heart disease Brother    Hyperlipidemia Brother    Colon polyps Brother     Clotting disorder Brother    Colon polyps Brother    Heart attack Brother    Colon polyps Brother    Esophageal cancer Neg Hx    Rectal cancer Neg Hx    Stomach cancer Neg Hx     Social History   Socioeconomic History   Marital status: Widowed    Spouse name: Not on file   Number of children: Not on file   Years of education: Not on file   Highest education level: Not on file  Occupational History   Not on file  Tobacco Use   Smoking status: Never   Smokeless tobacco: Never  Vaping Use   Vaping status: Never Used  Substance and Sexual Activity   Alcohol use: No    Alcohol/week: 0.0 standard drinks of alcohol  Drug use: No   Sexual activity: Not on file  Other Topics Concern   Not on file  Social History Narrative   Not on file   Social Drivers of Health   Financial Resource Strain: Not on file  Food Insecurity: Low Risk  (04/26/2023)   Received from [facility]   Hunger Vital Sign    Worried About Running Out of Food in the Last Year: Never true    Ran Out of Food in the Last Year: Never true  Transportation Needs: No Transportation Needs (04/26/2023)   Received from [facility]    In the past 12 months, has lack of reliable transportation kept you from medical appointments, meetings, work or from getting things needed for daily living? : No  Physical Activity: Not on file  Stress: Not on file  Social Connections: Not on file  Intimate Partner Violence: Not on file      Review of systems: All other review of systems negative except as mentioned in the HPI.   Physical Exam: Vitals:   04/21/23 0833  BP: 130/82  Pulse: 82   Body mass index is 25.23 kg/m. Gen:      No acute distress HEENT:  sclera anicteric CV: s1s2 rrr, no murmur Lungs: B/l clear. Abd:      soft, non-tender; no palpable masses, no distension Ext:    No edema Neuro: alert and oriented x 3 Psych: normal mood and affect  Data Reviewed:  Reviewed labs,  radiology imaging, old records and pertinent past GI work up     Assessment and Plan Assessment & Plan Gastroesophageal Reflux Disease (GERD) GERD symptoms have significantly improved with current treatment. Nausea has decreased from daily to once a month. No dysphagia, abdominal pain, or significant changes in bowel habits. Currently on pantoprazole  40 mg daily and Pepcid  as needed. Sucralfate  was discontinued due to throat irritation and is no longer necessary as symptoms have settled. Plan to reduce pantoprazole  to 20 mg daily if symptoms remain controlled to minimize medication use while maintaining symptom control. - Continue pantoprazole , consider reducing to 20 mg daily if symptoms remain controlled. - Continue Pepcid  as needed. - Discontinue sucralfate .  Diarrhea Occasional episodes of diarrhea, possibly related to dietary factors. Episodes are infrequent, self-limiting, and without systemic symptoms. She reports no significant discomfort or illness from these episodes. - Monitor frequency and triggers of diarrhea. - Advise dietary modifications if specific triggers are identified.  Follow-up Condition is well-managed with current treatment. No new concerning findings on recent liver ultrasound or gallbladder evaluation. Plan to follow up in one year unless symptoms change. - Schedule follow-up appointment in one year. - Send prescription for lower dose pantoprazole  if symptoms remain controlled.   This visit required >40 minutes of patient care (this includes precharting, chart review, review of results, face-to-face time used for counseling as well as treatment plan and follow-up. The patient was provided an opportunity to ask questions and all were answered. The patient agreed with the plan and demonstrated an understanding of the instructions.  Lorena Rolling , MD    CC: James, Natalie W, PA-C

## 2023-04-21 NOTE — Patient Instructions (Addendum)
 VISIT SUMMARY:  Today, we discussed your progress in managing nausea and other related symptoms. You have experienced significant improvement, with nausea now occurring less than once a month. Your current medications, Protonix and Pepcid, are working well, and you have successfully discontinued sucralfate. We also reviewed occasional episodes of diarrhea, which seem to be related to dietary factors.  YOUR PLAN:  -GASTROESOPHAGEAL REFLUX DISEASE (GERD): GERD is a condition where stomach acid frequently flows back into the tube connecting your mouth and stomach, causing irritation. Your symptoms have significantly improved with your current treatment. You should continue taking pantoprazole 40 mg daily and Pepcid as needed. If your symptoms remain controlled, you can reduce pantoprazole to 20 mg daily. You no longer need to take sucralfate.  -DIARRHEA: You have occasional episodes of diarrhea, likely related to certain foods. These episodes are infrequent and resolve quickly without causing significant discomfort. Please monitor the frequency and identify any dietary triggers. Adjust your diet if you notice specific foods causing these episodes.  INSTRUCTIONS:  Please schedule a follow-up appointment in one year. If your symptoms remain controlled, we will send a prescription for a lower dose of pantoprazole.  I appreciate the  opportunity to care for you  Thank You   Marsa Aris , MD

## 2023-04-30 ENCOUNTER — Encounter: Payer: Self-pay | Admitting: Gastroenterology

## 2023-06-09 ENCOUNTER — Other Ambulatory Visit: Payer: Self-pay

## 2023-06-09 DIAGNOSIS — M25529 Pain in unspecified elbow: Secondary | ICD-10-CM

## 2023-06-09 DIAGNOSIS — I70208 Unspecified atherosclerosis of native arteries of extremities, other extremity: Secondary | ICD-10-CM

## 2023-06-10 ENCOUNTER — Emergency Department (HOSPITAL_BASED_OUTPATIENT_CLINIC_OR_DEPARTMENT_OTHER)

## 2023-06-10 ENCOUNTER — Encounter (HOSPITAL_BASED_OUTPATIENT_CLINIC_OR_DEPARTMENT_OTHER): Payer: Self-pay | Admitting: Emergency Medicine

## 2023-06-10 ENCOUNTER — Emergency Department (HOSPITAL_BASED_OUTPATIENT_CLINIC_OR_DEPARTMENT_OTHER)
Admission: EM | Admit: 2023-06-10 | Discharge: 2023-06-10 | Disposition: A | Discharge status: Home / Self Care | Attending: Emergency Medicine | Admitting: Emergency Medicine

## 2023-06-10 ENCOUNTER — Encounter (HOSPITAL_COMMUNITY): Payer: Self-pay | Admitting: Orthopedic Surgery

## 2023-06-10 ENCOUNTER — Other Ambulatory Visit: Payer: Self-pay

## 2023-06-10 DIAGNOSIS — S52611A Displaced fracture of right ulna styloid process, initial encounter for closed fracture: Secondary | ICD-10-CM | POA: Diagnosis not present

## 2023-06-10 DIAGNOSIS — W06XXXA Fall from bed, initial encounter: Secondary | ICD-10-CM | POA: Diagnosis not present

## 2023-06-10 DIAGNOSIS — S6991XA Unspecified injury of right wrist, hand and finger(s), initial encounter: Secondary | ICD-10-CM | POA: Diagnosis present

## 2023-06-10 DIAGNOSIS — S52531A Colles' fracture of right radius, initial encounter for closed fracture: Secondary | ICD-10-CM | POA: Insufficient documentation

## 2023-06-10 LAB — CBC WITH DIFFERENTIAL/PLATELET
Abs Immature Granulocytes: 0.03 10*3/uL (ref 0.00–0.07)
Basophils Absolute: 0.1 10*3/uL (ref 0.0–0.1)
Basophils Relative: 1 %
Eosinophils Absolute: 0.1 10*3/uL (ref 0.0–0.5)
Eosinophils Relative: 1 %
HCT: 40 % (ref 36.0–46.0)
Hemoglobin: 13 g/dL (ref 12.0–15.0)
Immature Granulocytes: 0 %
Lymphocytes Relative: 17 %
Lymphs Abs: 1.8 10*3/uL (ref 0.7–4.0)
MCH: 30.2 pg (ref 26.0–34.0)
MCHC: 32.5 g/dL (ref 30.0–36.0)
MCV: 92.8 fL (ref 80.0–100.0)
Monocytes Absolute: 0.9 10*3/uL (ref 0.1–1.0)
Monocytes Relative: 8 %
Neutro Abs: 7.9 10*3/uL — ABNORMAL HIGH (ref 1.7–7.7)
Neutrophils Relative %: 73 %
Platelets: 408 10*3/uL — ABNORMAL HIGH (ref 150–400)
RBC: 4.31 MIL/uL (ref 3.87–5.11)
RDW: 13.5 % (ref 11.5–15.5)
WBC: 10.9 10*3/uL — ABNORMAL HIGH (ref 4.0–10.5)
nRBC: 0 % (ref 0.0–0.2)

## 2023-06-10 LAB — BASIC METABOLIC PANEL WITH GFR
Anion gap: 11 (ref 5–15)
BUN: 11 mg/dL (ref 8–23)
CO2: 26 mmol/L (ref 22–32)
Calcium: 9.4 mg/dL (ref 8.9–10.3)
Chloride: 106 mmol/L (ref 98–111)
Creatinine, Ser: 0.83 mg/dL (ref 0.44–1.00)
GFR, Estimated: >60 mL/min (ref >60)
Glucose, Bld: 98 mg/dL (ref 70–99)
Potassium: 4.2 mmol/L (ref 3.5–5.1)
Sodium: 143 mmol/L (ref 135–145)

## 2023-06-10 MED ORDER — ONDANSETRON HCL 4 MG/2ML IJ SOLN
4.0000 mg | Freq: Once | INTRAMUSCULAR | Status: AC
  Administered 2023-06-10: 4 mg via INTRAVENOUS
  Filled 2023-06-10: qty 2

## 2023-06-10 MED ORDER — FENTANYL CITRATE PF 50 MCG/ML IJ SOSY
50.0000 ug | PREFILLED_SYRINGE | Freq: Once | INTRAMUSCULAR | Status: AC
  Administered 2023-06-10: 50 ug via INTRAVENOUS
  Filled 2023-06-10: qty 1

## 2023-06-10 MED ORDER — MORPHINE SULFATE 15 MG PO TABS: 7.5000 mg | ORAL_TABLET | ORAL | 0 refills | Status: AC | PRN

## 2023-06-10 MED ORDER — LIDOCAINE-EPINEPHRINE (PF) 2 %-1:200000 IJ SOLN
10.0000 mL | Freq: Once | INTRAMUSCULAR | Status: AC
  Administered 2023-06-10: 10 mL via INTRADERMAL
  Filled 2023-06-10: qty 20

## 2023-06-10 MED ORDER — ONDANSETRON 4 MG PO TBDP: ORAL_TABLET | ORAL | 0 refills | Status: AC

## 2023-06-10 MED ORDER — SODIUM CHLORIDE 0.9 % IV BOLUS
1000.0000 mL | Freq: Once | INTRAVENOUS | Status: AC
  Administered 2023-06-10: 1000 mL via INTRAVENOUS

## 2023-06-10 NOTE — ED Triage Notes (Signed)
 Pt slid off of bed, caught herself and injured right wrist. Deformity noted in triage.

## 2023-06-10 NOTE — ED Provider Notes (Signed)
 [place] [facility] Provider Note   CSN: [id number] Arrival date & time: 06/10/23  0636     History  Chief Complaint  Patient presents with   Wrist Pain    Karen Fox is a 78 y.o. female.  78 yo F with a chief complaints of fall.  Patient states she was getting out of bed and she felt she had a cramp in her leg and then she started to lose her balance.  She think she used her right arm to stop her fall and is complaining mostly of right wrist pain.  She denies other injury.  During this event she felt quite nauseated and thought that she might pass out.  She feels a bit better now but is still little bit nauseated.  Has had trouble with her right shoulder before but does not think that was injured in the fall.  Denies head injury denies chest pain denies difficulty breathing.   Wrist Pain       Home Medications Prior to Admission medications   Medication Sig Start Date End Date Taking? Authorizing Provider  morphine (MSIR) 15 MG tablet Take 0.5 tablets (7.5 mg total) by mouth every 4 (four) hours as needed. 06/10/23  Yes Wil Slape, DO  ondansetron (ZOFRAN-ODT) 4 MG disintegrating tablet 4mg  ODT q4 hours prn nausea/vomit 06/10/23  Yes Baxter Gonzalez, DO  acetaminophen (TYLENOL) 325 MG tablet Take 650 mg by mouth every 6 (six) hours as needed.    [provider]  ALPRAZolam (XANAX) 0.5 MG tablet Take 0.5 mg by mouth at bedtime as needed for anxiety. Pt taking .25 mg as needed    [provider]  Butalbital-APAP-Caffeine (ZEBUTAL) 50-325-40 MG per capsule Take 1 capsule by mouth every 4 (four) hours as needed for pain.    [provider]  chlorpheniramine (CHLOR-TRIMETON) 4 MG tablet Take 4 mg by mouth 2 (two) times daily as needed for allergies.    [provider]  citalopram (CELEXA) 20 MG tablet Take 1 tablet by mouth daily. 04/12/13   [provider]  Cranberry 500 MG CAPS Take by mouth.     [provider]  Cyanocobalamin (B-12) 5000 MCG CAPS Take by mouth.    [provider]  dimenhyDRINATE (DRAMAMINE) 50 MG tablet Take 50 mg by mouth every 8 (eight) hours as needed.    [provider]  famotidine  (PEPCID ) 20 MG tablet TAKE 1 TABLET BY MOUTH EVERYDAY AT BEDTIME 12/29/22   Kennedy-Smith, Colleen M, NP  fenofibrate 160 MG tablet Take 160 mg by mouth daily.    [provider]  Fish Oil-Cholecalciferol (FISH OIL + D3 PO) Take by mouth.    [provider]  fluticasone (FLONASE) 50 MCG/ACT nasal spray Place 1-2 sprays into both nostrils daily. Reported on 06/06/2015 04/12/13   [provider]  fosinopril (MONOPRIL) 10 MG tablet Take 20 mg by mouth daily.     [provider]  ibuprofen (ADVIL) 100 MG/5ML suspension Take 200 mg by mouth every 4 (four) hours as needed.    [provider]  pantoprazole  (PROTONIX ) 20 MG tablet Take 1 tablet (20 mg total) by mouth daily. 04/21/23   Nandigam, Kavitha V, MD  Probiotic Product (PROBIOTIC DAILY PO) Take by mouth.    [provider]  sucralfate  (CARAFATE ) 1 GM/10ML suspension Take 10 mLs (1 g total) by mouth 2 (two) times daily as needed. 02/05/23   Nandigam, Kavitha V, MD  Vitamin A  2400 MCG (8000 UT) TABS Take by mouth.    [provider]      Allergies    Amoxicillin, Ciprofloxacin , Contrast media [iodinated contrast media], Doxycycline, Erythromycin, Metronidazole , and Propoxyphene    Review of Systems   Review of Systems  Physical Exam Updated Vital Signs BP 137/78 (BP Location: Left Arm)   Pulse 78   Temp 97.6 F (36.4 C) (Oral)   Resp 18   Ht 5\' 5"  (1.651 m)   Wt 68 kg   SpO2 99%   BMI 24.96 kg/m  Physical Exam Vitals and nursing note reviewed.  Constitutional:      General: She is not in acute distress.    Appearance: She is well-developed. She is not diaphoretic.  HENT:     Head: Normocephalic and atraumatic.  Eyes:     Pupils: Pupils  are equal, round, and reactive to light.  Cardiovascular:     Rate and Rhythm: Normal rate and regular rhythm.     Heart sounds: No murmur heard.    No friction rub. No gallop.  Pulmonary:     Effort: Pulmonary effort is normal.     Breath sounds: No wheezing or rales.  Abdominal:     General: There is no distension.     Palpations: Abdomen is soft.     Tenderness: There is no abdominal tenderness.  Musculoskeletal:        General: Swelling and tenderness present.     Cervical back: Normal range of motion and neck supple.     Comments: Pain and swelling to the distal radius on the right.  Pulse motor and sensation intact distally.  No obvious pain at the elbow along the humerus or shoulder.  No pain at the clavicle or AC joint.  No pain along the scapula.  Palpated from head to toe without any other obvious noted areas of bony tenderness.  Skin:    General: Skin is warm and dry.  Neurological:     Mental Status: She is alert and oriented to person, place, and time.  Psychiatric:        Behavior: Behavior normal.     ED Results / Procedures / Treatments   Labs (all labs ordered are listed, but only abnormal results are displayed) Labs Reviewed  CBC WITH DIFFERENTIAL/PLATELET - Abnormal; Notable for the following components:      Result Value   WBC 10.9 (*)    Platelets 408 (*)    Neutro Abs 7.9 (*)    All other components within normal limits  BASIC METABOLIC PANEL WITH GFR    EKG None  Radiology DG Wrist 2 Views Right Result Date: 06/10/2023 CLINICAL DATA:  Distal radius fracture, postreduction. EXAM: RIGHT WRIST - 2 VIEW COMPARISON:  Earlier today FINDINGS: Improved alignment of comminuted distal radial fracture. There is mild residual displacement. Unchanged alignment of ulna styloid fracture. Overlying splint/cast material in place. IMPRESSION: 1. Improved alignment of comminuted distal radial fracture with mild residual displacement. 2. Unchanged alignment of ulna  styloid fracture. Electronically Signed   By: Chadwick Colonel M.D.   On: 06/10/2023 11:11   DG Wrist 2 Views Right Result Date: 06/10/2023 CLINICAL DATA:  Radial fracture. EXAM: RIGHT WRIST - 2 VIEW COMPARISON:  Radiographs dated 06/10/2023 with FINDINGS: Dorsally angulated distal radial fracture as well as fracture of the base of the ulnar styloid relatively similar to prior radiograph. No dislocation. The bones are osteopenic. There has been interval placement of a cast. IMPRESSION: Fractures  of the distal radius and ulnar styloid status post cast placement. Electronically Signed   By: Angus Bark M.D.   On: 06/10/2023 10:32   DG Wrist Complete Right Result Date: 06/10/2023 CLINICAL DATA:  Fall with right wrist fracture. EXAM: RIGHT WRIST - COMPLETE 3+ VIEW; RIGHT HAND - COMPLETE 3+ VIEW COMPARISON:  None. FINDINGS: Right wrist, four views: Osteopenia. There is an acute transverse impacted fracture of the distal radial metaphysis with mild dorsal tilt of the main distal fracture fragment and 5 mm of dorsal and lateral translation, with dorsal comminution. The distal radial articular surface is grossly intact, with no displaced fracture along the articular surface. Also noted is a nondisplaced transverse oblique fracture across the base of the ulnar styloid. All carpal bones are intact. There is mild spurring and narrowing of the first [facility] and triscaphe joint. Arthritic changes are not seen elsewhere. There is diffuse soft tissue swelling in the distal forearm and wrist. Right hand, three views: No fracture or dislocation is seen in the hand. There are mild degenerative features of the interphalangeal joints in the fingers. Arthritic changes are not seen elsewhere. There is osteopenia. No notable soft tissue swelling in the hand. IMPRESSION: 1. Acute transverse impacted fracture of the distal radial metaphysis with mild dorsal tilt of the main distal fracture fragment and 5 mm of dorsal and lateral  translation. 2. Nondisplaced transverse oblique fracture across the base of the ulnar styloid. 3. Osteopenia and degenerative change. 4. No fracture or dislocation is seen in the hand. Electronically Signed   By: Denman Fischer M.D.   On: 06/10/2023 07:46   DG Hand Complete Right Result Date: 06/10/2023 CLINICAL DATA:  Fall with right wrist fracture. EXAM: RIGHT WRIST - COMPLETE 3+ VIEW; RIGHT HAND - COMPLETE 3+ VIEW COMPARISON:  None. FINDINGS: Right wrist, four views: Osteopenia. There is an acute transverse impacted fracture of the distal radial metaphysis with mild dorsal tilt of the main distal fracture fragment and 5 mm of dorsal and lateral translation, with dorsal comminution. The distal radial articular surface is grossly intact, with no displaced fracture along the articular surface. Also noted is a nondisplaced transverse oblique fracture across the base of the ulnar styloid. All carpal bones are intact. There is mild spurring and narrowing of the first [employer] [facility] [employer] and triscaphe joint. Arthritic changes are not seen elsewhere. There is diffuse soft tissue swelling in the distal forearm and wrist. Right hand, three views: No fracture or dislocation is seen in the hand. There are mild degenerative features of the interphalangeal joints in the fingers. Arthritic changes are not seen elsewhere. There is osteopenia. No notable soft tissue swelling in the hand. IMPRESSION: 1. Acute transverse impacted fracture of the distal radial metaphysis with mild dorsal tilt of the main distal fracture fragment and 5 mm of dorsal and lateral translation. 2. Nondisplaced transverse oblique fracture across the base of the ulnar styloid. 3. Osteopenia and degenerative change. 4. No fracture or dislocation is seen in the hand. Electronically Signed   By: Denman Fischer M.D.   On: 06/10/2023 07:46    Procedures .Reduction of fracture  Date/Time: 06/10/2023 9:29 AM  Performed by: Albertus Hughs, DO Authorized by: Albertus Hughs, DO   Consent: Verbal consent obtained. Risks and benefits: risks, benefits and alternatives were discussed Consent given by: patient Patient understanding: patient states understanding of the procedure being performed Imaging studies: imaging studies available Required items: required blood products, implants, devices, and special equipment available Patient identity confirmed: verbally  with patient Time out: Immediately prior to procedure a "time out" was called to verify the correct patient, procedure, equipment, support staff and site/side marked as required. Local anesthesia used: yes Anesthesia: hematoma block  Anesthesia: Local anesthesia used: yes Local Anesthetic: lidocaine 2% with epinephrine Anesthetic total: 10 mL  Sedation: Patient sedated: no  Patient tolerance: patient tolerated the procedure well with no immediate complications       Medications Ordered in ED Medications  sodium chloride  0.9 % bolus 1,000 mL (0 mLs Intravenous Stopped 06/10/23 0940)  fentaNYL (SUBLIMAZE) injection 50 mcg (50 mcg Intravenous Given 06/10/23 0803)  ondansetron (ZOFRAN) injection 4 mg (4 mg Intravenous Given 06/10/23 0803)  lidocaine-EPINEPHrine (XYLOCAINE W/EPI) 2 %-1:200000 (PF) injection 10 mL (10 mLs Intradermal Given by Other 06/10/23 0826)  fentaNYL (SUBLIMAZE) injection 50 mcg (50 mcg Intravenous Given 06/10/23 0914)  fentaNYL (SUBLIMAZE) injection 50 mcg (50 mcg Intravenous Given 06/10/23 0952)  ondansetron (ZOFRAN) injection 4 mg (4 mg Intravenous Given 06/10/23 1009)    ED Course/ Medical Decision Making/ A&P                                 Medical Decision Making Amount and/or Complexity of Data Reviewed Labs: ordered. Radiology: ordered. ECG/medicine tests: ordered.  Risk Prescription drug management.   78 yo F with a chief complaints of what sounds like a near syncopal event.  The patient was trying to get out of bed and she suddenly felt a cramp in her leg and then found  unwell and felt she was going to pass out.  During this she used her right arm to keep her from falling but unfortunately hurt her right wrist.  Likely has a distal radius fracture on exam.  Will obtain plain films.  Will near-syncopal event will obtain blood work.  IV fluids treat pain.  Reassess.  Plain film of the right wrist independently interpreted by me with the distal radius fracture with dorsal displacement.  Lab work is resulted without acute anemia, no significant electrolyte abnormalities.  EKG is without obvious concerning finding.  Attempt reduction at bedside.  Initial attempted reduction without significant change on repeat imaging.  I then attempted to reproduce it.  Seems fairly unstable and need to be splinted just right to try and hold it in place.  Repeat x-ray with some modest improvement on my independent interpretation.  I did discuss this with Steffanie Edouard, as the phones were down for the [facility] office.  He independently interpreted the images and felt reasonable for discharge at this time.  Will have her call the hand surgery office today to try and set up an appointment.  11:16 AM:  I have discussed the diagnosis/risks/treatment options with the patient and family.  Evaluation and diagnostic testing in the emergency department does not suggest an emergent condition requiring admission or immediate intervention beyond what has been performed at this time.  They will follow up with Hand. We also discussed returning to the ED immediately if new or worsening sx occur. We discussed the sx which are most concerning (e.g., sudden worsening pain, fever, inability to tolerate by mouth) that necessitate immediate return. Medications administered to the patient during their visit and any new prescriptions provided to the patient are listed below.  Medications given during this visit Medications  sodium chloride  0.9 % bolus 1,000 mL (0 mLs Intravenous Stopped 06/10/23 0940)   fentaNYL (SUBLIMAZE) injection 50 mcg (50  mcg Intravenous Given 06/10/23 0803)  ondansetron (ZOFRAN) injection 4 mg (4 mg Intravenous Given 06/10/23 0803)  lidocaine-EPINEPHrine (XYLOCAINE W/EPI) 2 %-1:200000 (PF) injection 10 mL (10 mLs Intradermal Given by Other 06/10/23 0826)  fentaNYL (SUBLIMAZE) injection 50 mcg (50 mcg Intravenous Given 06/10/23 0914)  fentaNYL (SUBLIMAZE) injection 50 mcg (50 mcg Intravenous Given 06/10/23 0952)  ondansetron (ZOFRAN) injection 4 mg (4 mg Intravenous Given 06/10/23 1009)     The patient appears reasonably screen and/or stabilized for discharge and I doubt any other medical condition or other [facility] requiring further screening, evaluation, or treatment in the ED at this time prior to discharge.          Final Clinical Impression(s) / ED Diagnoses Final diagnoses:  Closed Colles' fracture of right radius, initial encounter    Rx / DC Orders ED Discharge Orders          Ordered    morphine (MSIR) 15 MG tablet  Every 4 hours PRN        06/10/23 0925    ondansetron (ZOFRAN-ODT) 4 MG disintegrating tablet        06/10/23 0925              Albertus Hughs, DO 06/10/23 1116

## 2023-06-10 NOTE — Discharge Instructions (Signed)
 Take 4 over the counter ibuprofen tablets 3 times a day or 2 over-the-counter naproxen tablets twice a day for pain. Also take tylenol 1000mg (2 extra strength) four times a day.   Then take the pain medicine if you feel like you need it. Narcotics do not help with the pain, they only make you care about it less.  You can become addicted to this, people may break into your house to steal it.  It will constipate you.  If you drive under the influence of this medicine you can get a DUI.

## 2023-06-10 NOTE — Progress Notes (Signed)
 SDW call  Patient was given pre-op instructions over the phone. Patient verbalized understanding of instructions provided.     PCP - Terrance Ferretti, PA-C Cardiologist - Dr. Lauro Portal Pulmonary:    PPM/ICD - denies Device Orders - na Rep Notified - na   Chest x-ray - na EKG -  06/10/2023 Stress Test - 01/26/2012 ECHO - 03/23/2008 Cardiac Cath - 12/11/2008  Sleep Study/sleep apnea/CPAP: denies  Non-diabetic  Blood Thinner Instructions: denies Aspirin Instructions:denies   ERAS Protcol - Clears until 1300  Anesthesia review: No   Patient denies shortness of breath, fever, cough and chest pain over the phone call  Your procedure is scheduled on Friday Jun 11, 2023  Report to [facility] Main Entrance "A" at 1330 PM., then check in with the Admitting office.  Call this number if you have problems the morning of surgery:  [phone]   If you have any questions prior to your surgery date call [phone]: Open Monday-Friday 8am-4pm If you experience any cold or flu symptoms such as cough, fever, chills, shortness of breath, etc. between now and your scheduled surgery, please notify us  at the above number    Remember:  Do not eat after midnight the night before your surgery  You may drink clear liquids until  1300 the day of your surgery.   Clear liquids allowed are: Water, Non-Citrus Juices (without pulp), Carbonated Beverages, Clear Tea, Black Coffee ONLY (NO MILK, CREAM OR POWDERED CREAMER of any kind), and Gatorade   Take these medicines the morning of surgery with A SIP OF WATER:  Celexa, fenofibrate, flonase, protonix   As needed: Tylenol, morphine IR, zofran  As of today, STOP taking any Aspirin (unless otherwise instructed by your surgeon) Aleve, Naproxen, Ibuprofen, Motrin, Advil, Goody's, BC's, all herbal medications, fish oil, and all vitamins.

## 2023-06-11 ENCOUNTER — Encounter (HOSPITAL_COMMUNITY)
Admission: RE | Disposition: A | Discharge status: Home / Self Care | Payer: Self-pay | Source: Ambulatory Visit | Attending: Orthopedic Surgery

## 2023-06-11 ENCOUNTER — Ambulatory Visit (HOSPITAL_COMMUNITY)

## 2023-06-11 ENCOUNTER — Ambulatory Visit (HOSPITAL_COMMUNITY)
Admission: RE | Admit: 2023-06-11 | Discharge: 2023-06-11 | Disposition: A | Discharge status: Home / Self Care | Source: Ambulatory Visit | Attending: Orthopedic Surgery | Admitting: Orthopedic Surgery

## 2023-06-11 ENCOUNTER — Ambulatory Visit (HOSPITAL_COMMUNITY): Payer: Self-pay | Admitting: Anesthesiology

## 2023-06-11 ENCOUNTER — Other Ambulatory Visit: Payer: Self-pay

## 2023-06-11 ENCOUNTER — Encounter (HOSPITAL_COMMUNITY): Payer: Self-pay | Admitting: Orthopedic Surgery

## 2023-06-11 DIAGNOSIS — S52571A Other intraarticular fracture of lower end of right radius, initial encounter for closed fracture: Secondary | ICD-10-CM | POA: Diagnosis present

## 2023-06-11 DIAGNOSIS — Z8249 Family history of ischemic heart disease and other diseases of the circulatory system: Secondary | ICD-10-CM | POA: Insufficient documentation

## 2023-06-11 DIAGNOSIS — I10 Essential (primary) hypertension: Secondary | ICD-10-CM | POA: Diagnosis not present

## 2023-06-11 DIAGNOSIS — W19XXXA Unspecified fall, initial encounter: Secondary | ICD-10-CM | POA: Insufficient documentation

## 2023-06-11 DIAGNOSIS — K219 Gastro-esophageal reflux disease without esophagitis: Secondary | ICD-10-CM | POA: Insufficient documentation

## 2023-06-11 DIAGNOSIS — M1990 Unspecified osteoarthritis, unspecified site: Secondary | ICD-10-CM | POA: Diagnosis not present

## 2023-06-11 HISTORY — DX: Other complications of anesthesia, initial encounter: T88.59XA

## 2023-06-11 HISTORY — DX: Other specified postprocedural states: R11.2

## 2023-06-11 HISTORY — PX: OPEN REDUCTION INTERNAL FIXATION (ORIF) DISTAL RADIAL FRACTURE: SHX5989

## 2023-06-11 HISTORY — DX: Other specified postprocedural states: Z98.890

## 2023-06-11 SURGERY — OPEN REDUCTION INTERNAL FIXATION (ORIF) DISTAL RADIUS FRACTURE
Anesthesia: Monitor Anesthesia Care | Laterality: Right

## 2023-06-11 MED ORDER — CHLORHEXIDINE GLUCONATE 0.12 % MT SOLN: 15.0000 mL | Freq: Once | OROMUCOSAL | Status: AC

## 2023-06-11 MED ORDER — EPHEDRINE 5 MG/ML INJ
INTRAVENOUS | Status: AC
  Filled 2023-06-11: qty 5

## 2023-06-11 MED ORDER — ROPIVACAINE HCL 5 MG/ML IJ SOLN
INTRAMUSCULAR | Status: DC | PRN
Start: 2023-06-11 — End: 2023-06-11
  Administered 2023-06-11: 20 mL via PERINEURAL

## 2023-06-11 MED ORDER — ACETAMINOPHEN 325 MG PO TABS
ORAL_TABLET | ORAL | Status: AC
  Administered 2023-06-11: 1000 mg via ORAL
  Filled 2023-06-11: qty 3

## 2023-06-11 MED ORDER — DEXAMETHASONE SODIUM PHOSPHATE 10 MG/ML IJ SOLN
INTRAMUSCULAR | Status: DC | PRN
  Administered 2023-06-11: 5 mg via INTRAVENOUS

## 2023-06-11 MED ORDER — ACETAMINOPHEN 500 MG PO TABS
1000.0000 mg | ORAL_TABLET | Freq: Once | ORAL | Status: AC
  Filled 2023-06-11: qty 2

## 2023-06-11 MED ORDER — CEFAZOLIN SODIUM-DEXTROSE 2-4 GM/100ML-% IV SOLN
INTRAVENOUS | Status: AC
  Filled 2023-06-11: qty 100

## 2023-06-11 MED ORDER — ONDANSETRON HCL 4 MG/2ML IJ SOLN
INTRAMUSCULAR | Status: AC
  Filled 2023-06-11: qty 2

## 2023-06-11 MED ORDER — LACTATED RINGERS IV SOLN: INTRAVENOUS | Status: DC

## 2023-06-11 MED ORDER — ORAL CARE MOUTH RINSE: 15.0000 mL | Freq: Once | OROMUCOSAL | Status: AC

## 2023-06-11 MED ORDER — DEXAMETHASONE SODIUM PHOSPHATE 10 MG/ML IJ SOLN
INTRAMUSCULAR | Status: DC | PRN
Start: 2023-06-11 — End: 2023-06-11
  Administered 2023-06-11: 10 mg

## 2023-06-11 MED ORDER — PHENYLEPHRINE HCL-NACL 20-0.9 MG/250ML-% IV SOLN
INTRAVENOUS | Status: DC | PRN
  Administered 2023-06-11: 15 ug/min via INTRAVENOUS

## 2023-06-11 MED ORDER — CHLORHEXIDINE GLUCONATE 0.12 % MT SOLN
OROMUCOSAL | Status: AC
  Administered 2023-06-11: 15 mL via OROMUCOSAL
  Filled 2023-06-11: qty 15

## 2023-06-11 MED ORDER — 0.9 % SODIUM CHLORIDE (POUR BTL) OPTIME
TOPICAL | Status: DC | PRN
  Administered 2023-06-11: 1000 mL

## 2023-06-11 MED ORDER — ONDANSETRON HCL 4 MG/2ML IJ SOLN
INTRAMUSCULAR | Status: DC | PRN
  Administered 2023-06-11: 4 mg via INTRAVENOUS

## 2023-06-11 MED ORDER — CEFAZOLIN SODIUM-DEXTROSE 2-4 GM/100ML-% IV SOLN
2.0000 g | INTRAVENOUS | Status: AC
Start: 2023-06-11 — End: 2023-06-11
  Administered 2023-06-11: 2 g via INTRAVENOUS

## 2023-06-11 MED ORDER — PROPOFOL 500 MG/50ML IV EMUL
INTRAVENOUS | Status: DC | PRN
  Administered 2023-06-11: 125 ug/kg/min via INTRAVENOUS

## 2023-06-11 MED ORDER — FENTANYL CITRATE (PF) 100 MCG/2ML IJ SOLN
INTRAMUSCULAR | Status: AC
  Administered 2023-06-11: 50 ug
  Filled 2023-06-11: qty 2

## 2023-06-11 MED ORDER — DEXAMETHASONE SODIUM PHOSPHATE 10 MG/ML IJ SOLN
INTRAMUSCULAR | Status: AC
  Filled 2023-06-11: qty 1

## 2023-06-11 SURGICAL SUPPLY — 49 items
BAG COUNTER SPONGE SURGICOUNT (BAG) ×2 IMPLANT
BIT DRILL 2.2 SS TIBIAL (BIT) IMPLANT
BNDG COMPR ESMARK 4X3 LF (GAUZE/BANDAGES/DRESSINGS) ×2 IMPLANT
BNDG ELASTIC 3INX 5YD STR LF (GAUZE/BANDAGES/DRESSINGS) ×2 IMPLANT
BNDG ELASTIC 4X5.8 VLCR STR LF (GAUZE/BANDAGES/DRESSINGS) ×2 IMPLANT
BNDG GAUZE DERMACEA FLUFF 4 (GAUZE/BANDAGES/DRESSINGS) ×2 IMPLANT
CANISTER SUCTION 3000ML PPV (SUCTIONS) ×2 IMPLANT
CORD BIPOLAR FORCEPS 12FT (ELECTRODE) ×2 IMPLANT
COVER SURGICAL LIGHT HANDLE (MISCELLANEOUS) ×2 IMPLANT
CUFF TOURN SGL QUICK 18X4 (TOURNIQUET CUFF) ×2 IMPLANT
CUFF TRNQT CYL 24X4X16.5-23 (TOURNIQUET CUFF) IMPLANT
DRAPE OEC MINIVIEW 54X84 (DRAPES) ×2 IMPLANT
DRAPE SURG 17X11 SM STRL (DRAPES) ×2 IMPLANT
DRSG ADAPTIC 3X8 NADH LF (GAUZE/BANDAGES/DRESSINGS) ×2 IMPLANT
GAUZE 4X4 16PLY ~~LOC~~+RFID DBL (SPONGE) ×2 IMPLANT
GAUZE SPONGE 4X4 12PLY STRL (GAUZE/BANDAGES/DRESSINGS) ×2 IMPLANT
GAUZE XEROFORM 5X9 LF (GAUZE/BANDAGES/DRESSINGS) ×2 IMPLANT
GLOVE BIOGEL PI IND STRL 8.5 (GLOVE) ×2 IMPLANT
GLOVE SURG ORTHO 8.0 STRL STRW (GLOVE) ×2 IMPLANT
GOWN STRL REUS W/ TWL LRG LVL3 (GOWN DISPOSABLE) ×2 IMPLANT
GOWN STRL REUS W/ TWL XL LVL3 (GOWN DISPOSABLE) ×2 IMPLANT
KIT BASIN OR (CUSTOM PROCEDURE TRAY) ×2 IMPLANT
KIT TURNOVER KIT B (KITS) ×2 IMPLANT
KWIRE FX5X1.6XNS BN SS (WIRE) IMPLANT
NDL HYPO 25X1 1.5 SAFETY (NEEDLE) ×2 IMPLANT
NEEDLE HYPO 25X1 1.5 SAFETY (NEEDLE) ×1 IMPLANT
NS IRRIG 1000ML POUR BTL (IV SOLUTION) ×2 IMPLANT
PACK ORTHO EXTREMITY (CUSTOM PROCEDURE TRAY) ×2 IMPLANT
PAD ARMBOARD POSITIONER FOAM (MISCELLANEOUS) ×4 IMPLANT
PAD CAST 4YDX4 CTTN HI CHSV (CAST SUPPLIES) ×2 IMPLANT
PEG LOCKING SMOOTH 2.2X18 (Peg) IMPLANT
PEG LOCKING SMOOTH 2.2X20 (Screw) IMPLANT
PEG LOCKING SMOOTH 2.2X22 (Screw) IMPLANT
PLATE DVR CROSSLOCK STD RT (Plate) IMPLANT
SCREW LOCK 14X2.7X 3 LD TPR (Screw) IMPLANT
SCREW LOCK 16X2.7X 3 LD TPR (Screw) IMPLANT
SCREW LOCKING 2.7X15MM (Screw) IMPLANT
SLING ARM FOAM STRAP MED (SOFTGOODS) IMPLANT
SOAP 2 % CHG 4 OZ (WOUND CARE) ×2 IMPLANT
SPIKE FLUID TRANSFER (MISCELLANEOUS) ×2 IMPLANT
SPLINT FIBERGLASS 3X35 (CAST SUPPLIES) IMPLANT
SPONGE T-LAP 4X18 ~~LOC~~+RFID (SPONGE) ×2 IMPLANT
SUT MNCRL AB 4-0 PS2 18 (SUTURE) IMPLANT
SUT PROLENE 4 0 PS 2 18 (SUTURE) IMPLANT
TOWEL GREEN STERILE (TOWEL DISPOSABLE) ×2 IMPLANT
TOWEL GREEN STERILE FF (TOWEL DISPOSABLE) IMPLANT
TUBE CONNECTING 12X1/4 (SUCTIONS) ×2 IMPLANT
WATER STERILE IRR 1000ML POUR (IV SOLUTION) ×2 IMPLANT
YANKAUER SUCT BULB TIP NO VENT (SUCTIONS) IMPLANT

## 2023-06-11 NOTE — Transfer of Care (Signed)
 Immediate Anesthesia Transfer of Care Note  Patient: Karen Fox  Procedure(s) Performed: OPEN REDUCTION INTERNAL FIXATION (ORIF) DISTAL RADIUS FRACTURE (Right)  Patient Location: PACU  Anesthesia Type:MAC and Regional  Level of Consciousness: awake, alert , and oriented  Airway & Oxygen Therapy: Patient Spontanous Breathing  Post-op Assessment: Report given to RN and Post -op Vital signs reviewed and stable  Post vital signs: Reviewed and stable  Last Vitals:  Vitals Value Taken Time  BP 130/72 06/11/23 1713  Temp 36.9 C 06/11/23 1713  Pulse 74 06/11/23 1714  Resp 11 06/11/23 1714  SpO2 96 % 06/11/23 1714  Vitals shown include unfiled device data.  Last Pain:  Vitals:   06/11/23 1555  TempSrc:   PainSc: 0-No pain         Complications: No notable events documented.

## 2023-06-11 NOTE — Anesthesia Procedure Notes (Signed)
 Anesthesia Regional Block: Supraclavicular block   Pre-Anesthetic Checklist: , timeout performed,  Correct Patient, Correct Site, Correct Laterality,  Correct Procedure, Correct Position, site marked,  Risks and benefits discussed,  Surgical consent,  Pre-op evaluation,  At surgeon's request and post-op pain management  Laterality: Right  Prep: Dura Prep       Needles:  Injection technique: Single-shot  Needle Type: Echogenic Stimulator Needle     Needle Length: 5cm  Needle Gauge: 20     Additional Needles:   Procedures:,,,, ultrasound used (permanent image in chart),,    Narrative:  Start time: 06/11/2023 3:45 PM End time: 06/11/2023 3:50 PM Injection made incrementally with aspirations every 5 mL.  Performed by: Personally  Anesthesiologist: Micheal Agent, DO  Additional Notes: Patient identified. Risks/Benefits/Options discussed with patient including but not limited to bleeding, infection, nerve damage, failed block, incomplete pain control. Patient expressed understanding and wished to proceed. All questions were answered. Sterile technique was used throughout the entire procedure. Please see nursing notes for vital signs. Aspirated in 5cc intervals with injection for negative confirmation. Patient was given instructions on fall risk and not to get out of bed. All questions and concerns addressed with instructions to call with any issues or inadequate analgesia.

## 2023-06-11 NOTE — Anesthesia Preprocedure Evaluation (Signed)
 Anesthesia Evaluation    History of Anesthesia Complications (+) PONV and history of anesthetic complications  Airway Mallampati: II  TM Distance: >3 FB Neck ROM: Full    Dental no notable dental hx.    Pulmonary neg pulmonary ROS   Pulmonary exam normal        Cardiovascular hypertension,  Rhythm:Regular Rate:Normal     Neuro/Psych   Anxiety     negative neurological ROS     GI/Hepatic Neg liver ROS,GERD  Medicated,,  Endo/Other  negative endocrine ROS    Renal/GU negative Renal ROS  negative genitourinary   Musculoskeletal  (+) Arthritis , Osteoarthritis,    Abdominal Normal abdominal exam  (+)   Peds  Hematology  (+) Blood dyscrasia, anemia Lab Results      Component                Value               Date                      WBC                      10.9 (H)            06/10/2023                HGB                      13.0                06/10/2023                HCT                      40.0                06/10/2023                MCV                      92.8                06/10/2023                PLT                      408 (H)             06/10/2023             Lab Results      Component                Value               Date                      NA                       143                 06/10/2023                K                        4.2  06/10/2023                CO2                      26                  06/10/2023                GLUCOSE                  98                  06/10/2023                BUN                      11                  06/10/2023                CREATININE               0.83                06/10/2023                CALCIUM                  9.4                 06/10/2023                GFR                      59.55 (L)           01/27/2023                GFRNONAA                 >60                 06/10/2023              Anesthesia Other  Findings   Reproductive/Obstetrics                             Anesthesia Physical Anesthesia Plan  ASA: 2  Anesthesia Plan: MAC and Regional   Post-op Pain Management: Regional block* and Tylenol PO (pre-op)*   Induction: Intravenous  PONV Risk Score and Plan: 3 and Ondansetron, Dexamethasone, Propofol infusion and Treatment may vary due to age or medical condition  Airway Management Planned: Simple Face Mask and Nasal Cannula  Additional Equipment: None  Intra-op Plan:   Post-operative Plan:   Informed Consent: I have reviewed the patients History and Physical, chart, labs and discussed the procedure including the risks, benefits and alternatives for the proposed anesthesia with the patient or authorized representative who has indicated his/her understanding and acceptance.     Dental advisory given  Plan Discussed with: CRNA  Anesthesia Plan Comments:        Anesthesia Quick Evaluation

## 2023-06-11 NOTE — Discharge Instructions (Signed)
KEEP BANDAGE CLEAN AND DRY °CALL OFFICE FOR F/U APPT [phone] IN 2 WEEKS °KEEP HAND ELEVATED ABOVE HEART °OK TO APPLY ICE TO OPERATIVE AREA °CONTACT OFFICE IF ANY WORSENING PAIN OR CONCERNS. °

## 2023-06-11 NOTE — H&P (Signed)
 Karen Fox is an 78 y.o. female.   Chief Complaint: RIGHT DISTAL RADIUS INJURY HPI: PT HERE FOR SURGERY TODAY PT SUSTAINED FALL ON OUTSTRETCHED RIGHT WRIST PT WITH DISPLACED RIGHT DISTAL RADIUS FRACTURE   Past Medical History:  Diagnosis Date   Allergy     Anemia    years ago before hysterectomy   Anxiety    little   Arthritis    feet   Blood transfusion without reported diagnosis    34 years ago   Cataract    Complication of anesthesia    GERD (gastroesophageal reflux disease)    mild   History of urticaria 08/23/2018   Hyperlipidemia    Hypertension    Osteopenia    uses vitamin D for this    Osteoporosis    osteopenia   PONV (postoperative nausea and vomiting)     Past Surgical History:  Procedure Laterality Date   CARDIAC CATHETERIZATION  12/11/2008   No intervention - continue medical therapy.   CARDIOVASCULAR STRESS TEST  01/26/2012   Mild ischemia in the distal LAD artery distribution.   COLONOSCOPY  2020   COLONOSCOPY W/ POLYPECTOMY  09/01/2022   Veena Nandigam at [facility]   SHOULDER SURGERY  2021   SINOSCOPY     TRANSTHORACIC ECHOCARDIOGRAM  03/23/2008   EF 68%, no regional wall motion abnormalities noted, no significant valvular abnormalities.    Family History  Problem Relation Age of Onset   Kidney failure Mother    Cancer Father        Colon cancer   Stroke Father    Heart disease Father    Colon cancer Father    Mitral valve prolapse Sister    Heart attack Brother    Heart disease Brother    Colon polyps Brother    Heart attack Brother    Heart disease Brother    Hyperlipidemia Brother    Colon polyps Brother    Clotting disorder Brother    Colon polyps Brother    Heart attack Brother    Colon polyps Brother    Esophageal cancer Neg Hx    Rectal cancer Neg Hx    Stomach cancer Neg Hx    Social History:  reports that she has never smoked. She has never used smokeless tobacco. She reports that she does not drink alcohol and does not  use drugs.  Allergies:  Allergies  Allergen Reactions   Amoxicillin Nausea Only   Ciprofloxacin  Nausea And Vomiting   Contrast Media [Iodinated Contrast Media] Hives   Doxycycline Nausea And Vomiting    Patient does not recall reaction   Erythromycin     REACTION: rash   Metronidazole  Other (See Comments)    Hands and arm felting tingling, numb   Propoxyphene Other (See Comments)    Hyper Darvon    No medications prior to admission.    Results for orders placed or performed during the hospital encounter of 06/10/23 (from the past 48 hours)  CBC with Differential     Status: Abnormal   Collection Time: 06/10/23  7:06 AM  Result Value Ref Range   WBC 10.9 (H) 4.0 - 10.5 K/uL   RBC 4.31 3.87 - 5.11 MIL/uL   Hemoglobin 13.0 12.0 - 15.0 g/dL   HCT 46.9 62.9 - 52.8 %   MCV 92.8 80.0 - 100.0 fL   MCH 30.2 26.0 - 34.0 pg   MCHC 32.5 30.0 - 36.0 g/dL   RDW 41.3 24.4 - 01.0 %  Platelets 408 (H) 150 - 400 K/uL   nRBC 0.0 0.0 - 0.2 %   Neutrophils Relative % 73 %   Neutro Abs 7.9 (H) 1.7 - 7.7 K/uL   Lymphocytes Relative 17 %   Lymphs Abs 1.8 0.7 - 4.0 K/uL   Monocytes Relative 8 %   Monocytes Absolute 0.9 0.1 - 1.0 K/uL   Eosinophils Relative 1 %   Eosinophils Absolute 0.1 0.0 - 0.5 K/uL   Basophils Relative 1 %   Basophils Absolute 0.1 0.0 - 0.1 K/uL   Immature Granulocytes 0 %   Abs Immature Granulocytes 0.03 0.00 - 0.07 K/uL    Comment: Performed at [facility], [address] [place], [place] [address]  Basic metabolic panel     Status: None   Collection Time: 06/10/23  7:06 AM  Result Value Ref Range   Sodium 143 135 - 145 mmol/L   Potassium 4.2 3.5 - 5.1 mmol/L   Chloride 106 98 - 111 mmol/L   CO2 26 22 - 32 mmol/L   Glucose, Bld 98 70 - 99 mg/dL    Comment: Glucose reference range applies only to samples taken after fasting for at least 8 hours.   BUN 11 8 - 23 mg/dL   Creatinine, Ser 3.24 0.44 - 1.00 mg/dL   Calcium 9.4 8.9 - 40.1 mg/dL   GFR,  Estimated >02 >72 mL/min    Comment: (NOTE) Calculated using the CKD-EPI Creatinine Equation (2021)    Anion gap 11 5 - 15    Comment: Performed at [facility], [address] [place], [place] [address]   DG Wrist 2 Views Right Result Date: 06/10/2023 CLINICAL DATA:  Distal radius fracture, postreduction. EXAM: RIGHT WRIST - 2 VIEW COMPARISON:  Earlier today FINDINGS: Improved alignment of comminuted distal radial fracture. There is mild residual displacement. Unchanged alignment of ulna styloid fracture. Overlying splint/cast material in place. IMPRESSION: 1. Improved alignment of comminuted distal radial fracture with mild residual displacement. 2. Unchanged alignment of ulna styloid fracture. Electronically Signed   By: Chadwick Colonel M.D.   On: 06/10/2023 11:11   DG Wrist 2 Views Right Result Date: 06/10/2023 CLINICAL DATA:  Radial fracture. EXAM: RIGHT WRIST - 2 VIEW COMPARISON:  Radiographs dated 06/10/2023 with FINDINGS: Dorsally angulated distal radial fracture as well as fracture of the base of the ulnar styloid relatively similar to prior radiograph. No dislocation. The bones are osteopenic. There has been interval placement of a cast. IMPRESSION: Fractures of the distal radius and ulnar styloid status post cast placement. Electronically Signed   By: Angus Bark M.D.   On: 06/10/2023 10:32   DG Wrist Complete Right Result Date: 06/10/2023 CLINICAL DATA:  Fall with right wrist fracture. EXAM: RIGHT WRIST - COMPLETE 3+ VIEW; RIGHT HAND - COMPLETE 3+ VIEW COMPARISON:  None. FINDINGS: Right wrist, four views: Osteopenia. There is an acute transverse impacted fracture of the distal radial metaphysis with mild dorsal tilt of the main distal fracture fragment and 5 mm of dorsal and lateral translation, with dorsal comminution. The distal radial articular surface is grossly intact, with no displaced fracture along the articular surface. Also noted is a nondisplaced transverse  oblique fracture across the base of the ulnar styloid. All carpal bones are intact. There is mild spurring and narrowing of the first [facility] and triscaphe joint. Arthritic changes are not seen elsewhere. There is diffuse soft tissue swelling in the distal forearm and wrist. Right hand, three views:  No fracture or dislocation is seen in the hand. There are mild degenerative features of the interphalangeal joints in the fingers. Arthritic changes are not seen elsewhere. There is osteopenia. No notable soft tissue swelling in the hand. IMPRESSION: 1. Acute transverse impacted fracture of the distal radial metaphysis with mild dorsal tilt of the main distal fracture fragment and 5 mm of dorsal and lateral translation. 2. Nondisplaced transverse oblique fracture across the base of the ulnar styloid. 3. Osteopenia and degenerative change. 4. No fracture or dislocation is seen in the hand. Electronically Signed   By: Denman Fischer M.D.   On: 06/10/2023 07:46   DG Hand Complete Right Result Date: 06/10/2023 CLINICAL DATA:  Fall with right wrist fracture. EXAM: RIGHT WRIST - COMPLETE 3+ VIEW; RIGHT HAND - COMPLETE 3+ VIEW COMPARISON:  None. FINDINGS: Right wrist, four views: Osteopenia. There is an acute transverse impacted fracture of the distal radial metaphysis with mild dorsal tilt of the main distal fracture fragment and 5 mm of dorsal and lateral translation, with dorsal comminution. The distal radial articular surface is grossly intact, with no displaced fracture along the articular surface. Also noted is a nondisplaced transverse oblique fracture across the base of the ulnar styloid. All carpal bones are intact. There is mild spurring and narrowing of the first [facility] and triscaphe joint. Arthritic changes are not seen elsewhere. There is diffuse soft tissue swelling in the distal forearm and wrist. Right hand, three views: No fracture or dislocation is seen in the hand. There are mild degenerative features of the  interphalangeal joints in the fingers. Arthritic changes are not seen elsewhere. There is osteopenia. No notable soft tissue swelling in the hand. IMPRESSION: 1. Acute transverse impacted fracture of the distal radial metaphysis with mild dorsal tilt of the main distal fracture fragment and 5 mm of dorsal and lateral translation. 2. Nondisplaced transverse oblique fracture across the base of the ulnar styloid. 3. Osteopenia and degenerative change. 4. No fracture or dislocation is seen in the hand. Electronically Signed   By: Denman Fischer M.D.   On: 06/10/2023 07:46    ROS NO RECENT ILLNESSES OR HOSPITALIZATIONS  There were no vitals taken for this visit. Physical Exam  General Appearance:  Alert, cooperative, no distress, appears stated age  Head:  Normocephalic, without obvious abnormality, atraumatic  Eyes:  Pupils equal, conjunctiva/corneas clear,         Throat: Lips, mucosa, and tongue normal; teeth and gums normal  Neck: No visible masses     Lungs:   respirations unlabored  Chest Wall:  No tenderness or deformity  Heart:  Regular rate and rhythm,  Abdomen:   Soft, non-tender,         Extremities: RUE: SPLINT INTACT, FINGERS WARM WELL PERFUSED ABLE TO EXTEND THUMB  Pulses: 2+ and symmetric  Skin: Skin color, texture, turgor normal, no rashes or lesions     Neurologic: Normal    Assessment/Plan RIGHT DISPLACED DISTAL RADIUS FRACTURE  RIGHT DISTAL RADIUS OPEN REDUCTION AND INTERNAL FIXATION AND REPAIR AS INDICATED  R/B/A DISCUSSED WITH PT IN OFFICE.  PT VOICED UNDERSTANDING OF PLAN CONSENT SIGNED DAY OF SURGERY PT SEEN AND EXAMINED PRIOR TO OPERATIVE PROCEDURE/DAY OF SURGERY SITE MARKED. QUESTIONS ANSWERED WILL GO HOME FOLLOWING SURGERY   WE ARE PLANNING SURGERY FOR YOUR UPPER EXTREMITY. THE RISKS AND BENEFITS OF SURGERY INCLUDE BUT NOT LIMITED TO BLEEDING INFECTION, DAMAGE TO NEARBY NERVES ARTERIES TENDONS, FAILURE OF SURGERY TO ACCOMPLISH ITS INTENDED GOALS,  PERSISTENT SYMPTOMS  AND NEED FOR FURTHER SURGICAL INTERVENTION. WITH THIS IN MIND WE WILL PROCEED. I HAVE DISCUSSED WITH THE PATIENT THE PRE AND POSTOPERATIVE REGIMEN AND THE DOS AND DON'TS. PT VOICED UNDERSTANDING AND INFORMED CONSENT SIGNED.   Shellie Dials 06/11/2023, 7:42 AM

## 2023-06-11 NOTE — Op Note (Signed)
 PREOPERATIVE DIAGNOSIS: Right wrist intra-articular distal radius fracture 3 more fragments  POSTOPERATIVE DIAGNOSIS: Same  ATTENDING SURGEON: Dr. Regenia Cape who scrubbed and present for the entire procedure  ASSISTANT SURGEON: None  ANESTHESIA: Regional with IV sedation  OPERATIVE PROCEDURE: Open treatment of right wrist intra-articular distal radius fracture 3 more fragments requiring internal fixation Right wrist brachioradialis tendon release, tendon tenotomy Radiographs 3 views right wrist  IMPLANTS: DVR cross lock [employer]  EBL: Minimal  RADIOGRAPHIC INTERPRETATION: AP and lateral oblique views of the wrist do show the volar plate fixation placed in good position  SURGICAL INDICATIONS: Patient is a right-hand-dominant female who sustained a closed injury to the right wrist.  Patient was seen and evaluated the office and recommend undergo the above procedure.  Risks of surgery include but not limited to bleeding infection damage nearby nerves arteries or tendons loss of motion of the wrist and digits incomplete relief of symptoms and need for further surgical invention.  A signed informed consent was obtained on the day of surgery.  SURGICAL TECHNIQUE: The patient was prepped identified in the preoperative holding area marked apart a marker made on the right wrist indicate the correct operative site.  The patient then brought back the operating placed supine on the anesthesia table where the regional anesthetic was administered.  Patient tolerated well.  A well-padded tourniquet placed on the right brachium seal with the appropriate drape.  The right upper extremities then prepped and draped normal sterile fashion.  A timeout was called the correct site was identified procedure then began.  Attention was then turned to the right wrist the limb was then elevated.  Preoperative antibiotics were given prior to skin incision.  Tourniquet insufflated.  Dissection carried down through the skin  and subcutaneous tissue.  The FCR sheath was then opened proximally distally.  Following this going through the floor the FCR sheath deep dissection carried down to elevate the pronator quadratus.  Pronator quadratus was elevated in an L-shaped fashion.  Once this was carried out open reduction was then able to be begun.  In order to reduce the radial column separate intervention was undertaken.  The brachial radialis was carefully released along the radial column.  This exposed the intra-articular fragments of 3 more fragments.  Open reduction was then performed.  Tendon tenotomy was carried out.  The wound was then thoroughly irrigated.  The volar plate was then applied.  It was held distally with a K wire.  Plate confirmation was then confirmed.  Distal fixation was then carried out from an ulnar to radial direction of the distal locking pegs.  Following this final shaft fixation was then carried out with combination of locking nonlocking screws.  The wound was irrigated.  Final radiographs were then obtained.  Pronator quadratus was then closed with 2-0 Vicryl suture subcutaneous tissue was closed with Monocryl and skin closed with simple Prolene sutures.  Adaptic dressing sterile compressive bandage then applied.  The patient tolerated the procedure well.  Patient was placed in a well-padded volar splint taken to recovery room in good condition.  POSTOPERATIVE PLAN: Patient be discharged home.  See him back in the office in approxi-2 weeks for wound check suture removal x-rays application of short arm cast placed the therapy order to begin at the 4-week mark.  Radiographs at each visit.

## 2023-06-12 NOTE — Anesthesia Postprocedure Evaluation (Signed)
 Anesthesia Post Note  Patient: Karen Fox  Procedure(s) Performed: OPEN REDUCTION INTERNAL FIXATION (ORIF) DISTAL RADIUS FRACTURE (Right)     Patient location during evaluation: PACU Anesthesia Type: Regional Level of consciousness: awake and alert Pain management: pain level controlled Vital Signs Assessment: post-procedure vital signs reviewed and stable Respiratory status: spontaneous breathing, nonlabored ventilation, respiratory function stable and patient connected to nasal cannula oxygen Cardiovascular status: blood pressure returned to baseline and stable Postop Assessment: no apparent nausea or vomiting Anesthetic complications: no   No notable events documented.  Last Vitals:  Vitals:   06/11/23 1730 06/11/23 1745  BP: (!) 141/77 (!) 149/73  Pulse: 69 68  Resp: 11 (!) 9  Temp:  36.9 C  SpO2: 96% 95%    Last Pain:  Vitals:   06/11/23 1745  TempSrc:   PainSc: 0-No pain                 Leslye Rast

## 2023-06-14 ENCOUNTER — Encounter (HOSPITAL_COMMUNITY): Payer: Self-pay | Admitting: Orthopedic Surgery

## 2023-06-17 ENCOUNTER — Other Ambulatory Visit: Payer: Self-pay | Admitting: Nurse Practitioner

## 2023-06-23 NOTE — Progress Notes (Signed)
 Patient ID: Karen Fox, female   DOB: [birth year], 78 y.o.   MRN: [id number]  Reason for Consult: New Patient (Initial Visit)   Referred by Margaret Sharp, PA-C  Subjective:     HPI Karen Fox is a 78 y.o. female presenting for evaluation of right hamstring pain.  She also reports she has some purplish discoloration of bilateral feet and toes, typically happening towards the evening time.  She denies claudication, rest pain or slow/nonhealing wounds.  She does have some nocturnal calf cramps and recently fell while getting out of bed in the morning and broke her right wrist.  She reports some burning shooting sensation in the right hamstring area that comes on at random/intermittent times.  She denies any previous vascular interventions.  She denies any swelling although does have some small telangiectasias and varicosities.  Past Medical History:  Diagnosis Date   Allergy     Anemia    years ago before hysterectomy   Anxiety    little   Arthritis    feet   Blood transfusion without reported diagnosis    34 years ago   Cataract    Complication of anesthesia    GERD (gastroesophageal reflux disease)    mild   History of urticaria 08/23/2018   Hyperlipidemia    Hypertension    Osteopenia    uses vitamin D for this    Osteoporosis    osteopenia   PONV (postoperative nausea and vomiting)    Family History  Problem Relation Age of Onset   Kidney failure Mother    Cancer Father        Colon cancer   Stroke Father    Heart disease Father    Colon cancer Father    Mitral valve prolapse Sister    Heart attack Brother    Heart disease Brother    Colon polyps Brother    Heart attack Brother    Heart disease Brother    Hyperlipidemia Brother    Colon polyps Brother    Clotting disorder Brother    Colon polyps Brother    Heart attack Brother    Colon polyps Brother    Esophageal cancer Neg Hx    Rectal cancer Neg Hx    Stomach cancer Neg Hx    Past  Surgical History:  Procedure Laterality Date   CARDIAC CATHETERIZATION  12/11/2008   No intervention - continue medical therapy.   CARDIOVASCULAR STRESS TEST  01/26/2012   Mild ischemia in the distal LAD artery distribution.   COLONOSCOPY  2020   COLONOSCOPY W/ POLYPECTOMY  09/01/2022   [facility]igam at [facility]   OPEN REDUCTION INTERNAL FIXATION (ORIF) DISTAL RADIAL FRACTURE Right 06/11/2023   Procedure: OPEN REDUCTION INTERNAL FIXATION (ORIF) DISTAL RADIUS FRACTURE;  Surgeon: Arvil Birks, MD;  Location: [facility] OR;  Service: Orthopedics;  Laterality: Right;   SHOULDER SURGERY  2021   SINOSCOPY     TRANSTHORACIC ECHOCARDIOGRAM  03/23/2008   EF 68%, no regional wall motion abnormalities noted, no significant valvular abnormalities.    Short Social History:  Social History   Tobacco Use   Smoking status: Never   Smokeless tobacco: Never  Substance Use Topics   Alcohol use: No    Alcohol/week: 0.0 standard drinks of alcohol    Allergies  Allergen Reactions   Amoxicillin Nausea Only   Ciprofloxacin  Nausea And Vomiting   Contrast Media [Iodinated Contrast Media] Hives   Doxycycline Nausea And Vomiting    Patient does  not recall reaction   Erythromycin     REACTION: rash   Metronidazole  Other (See Comments)    Hands and arm felting tingling, numb   Propoxyphene Other (See Comments)    Hyper Darvon    Current Outpatient Medications  Medication Sig Dispense Refill   acetaminophen  (TYLENOL ) 325 MG tablet Take 650 mg by mouth every 6 (six) hours as needed.     ALPRAZolam (XANAX) 0.5 MG tablet Take 0.5 mg by mouth at bedtime as needed for anxiety. Pt taking .25 mg as needed     Butalbital-APAP-Caffeine (ZEBUTAL) 50-325-40 MG per capsule Take 1 capsule by mouth every 4 (four) hours as needed for pain.     chlorpheniramine (CHLOR-TRIMETON) 4 MG tablet Take 4 mg by mouth 2 (two) times daily as needed for allergies.     citalopram (CELEXA) 20 MG tablet Take 1 tablet by mouth daily.      Cyanocobalamin (B-12) 5000 MCG CAPS Take by mouth.     dimenhyDRINATE (DRAMAMINE) 50 MG tablet Take 50 mg by mouth every 8 (eight) hours as needed.     famotidine  (PEPCID ) 20 MG tablet TAKE 1 TABLET BY MOUTH EVERYDAY AT BEDTIME 90 tablet 1   fenofibrate 160 MG tablet Take 160 mg by mouth daily.     fluticasone (FLONASE) 50 MCG/ACT nasal spray Place 1-2 sprays into both nostrils as needed for allergies. Reported on 06/06/2015     fosinopril (MONOPRIL) 10 MG tablet Take 20 mg by mouth daily.      ibuprofen (ADVIL) 100 MG/5ML suspension Take 200 mg by mouth every 4 (four) hours as needed.     morphine  (MSIR) 15 MG tablet Take 0.5 tablets (7.5 mg total) by mouth every 4 (four) hours as needed. 5 tablet 0   ondansetron  (ZOFRAN -ODT) 4 MG disintegrating tablet 4mg  ODT q4 hours prn nausea/vomit 20 tablet 0   pantoprazole  (PROTONIX ) 20 MG tablet Take 1 tablet (20 mg total) by mouth daily. 90 tablet 3   Probiotic Product (PROBIOTIC DAILY PO) Take by mouth.     Vitamin A 2400 MCG (8000 UT) TABS Take by mouth.     Cranberry 500 MG CAPS Take by mouth. (Patient not taking: Reported on 06/25/2023)     Fish Oil-Cholecalciferol (FISH OIL + D3 PO) Take by mouth. (Patient not taking: Reported on 06/25/2023)     sucralfate  (CARAFATE ) 1 GM/10ML suspension Take 10 mLs (1 g total) by mouth 2 (two) times daily as needed. (Patient not taking: Reported on 06/25/2023) 473 mL 1   No current facility-administered medications for this visit.    REVIEW OF SYSTEMS  All other systems were reviewed and are negative     Objective:  Objective   Vitals:   06/25/23 0831  BP: 125/83  Pulse: 84  Resp: 18  Temp: 98.3 F (36.8 C)  TempSrc: Temporal  SpO2: 97%  Weight: 148 lb 9.6 oz (67.4 kg)  Height: 5' 5 (1.651 m)   Body mass index is 24.73 kg/m.  Physical Exam General: no acute distress Cardiac: hemodynamically stable Pulm: normal work of breathing Abdomen: non-tender, no pulsatile mass Neuro: alert, no focal  deficit Extremities: Very mild edema of mid lower leg, small telangiectasias and varicosities bilaterally, no fullness palpated in the popliteal space, no wounds present Vascular:   Right: Palpable femoral, PT  Left: Palpable femoral, PT  Data: ABI ABI Findings:  +---------+------------------+-----+-----------+--------+  Right   Rt Pressure (mmHg)IndexWaveform   Comment   +---------+------------------+-----+-----------+--------+  PTA  157               1.17 multiphasic          +---------+------------------+-----+-----------+--------+  DP      130               0.97 multiphasic          +---------+------------------+-----+-----------+--------+  Great Toe91                0.68                      +---------+------------------+-----+-----------+--------+   +---------+------------------+-----+-----------+-------+  Left    Lt Pressure (mmHg)IndexWaveform   Comment  +---------+------------------+-----+-----------+-------+  Brachial 134                                        +---------+------------------+-----+-----------+-------+  PTA     159               1.19 multiphasic         +---------+------------------+-----+-----------+-------+  DP      143               1.07 multiphasic         +---------+------------------+-----+-----------+-------+  Great Toe83                0.62                     +---------+------------------+-----+-----------+-------+      Assessment/Plan:   Karen Fox is a 78 y.o. female with chronic venous insufficiency.  Her symptoms appear mild at this time.  She does have some small telangiectasias and varicosities although does not have significant swelling.  She is most concerned with her intermittent hamstring pain and nocturnal cramps.  I explained that sometimes this can be related to venous insufficiency and increased pressure in the venous system.  We discussed the foundation of treatment and I  recommended using over-the-counter compression stockings and intermittent elevation.  I also explained that her pain may be coming from a sciatic nerve impingement and if treatment of venous insufficiency does not improve her symptoms she is to seek out a spinal referral from her PCP.  Follow up prn   Philipp Brawn MD [facility]

## 2023-06-25 ENCOUNTER — Other Ambulatory Visit: Payer: Self-pay | Admitting: Vascular Surgery

## 2023-06-25 ENCOUNTER — Ambulatory Visit (HOSPITAL_COMMUNITY)
Admission: RE | Admit: 2023-06-25 | Discharge: 2023-06-25 | Disposition: A | Discharge status: Home / Self Care | Source: Ambulatory Visit | Attending: Vascular Surgery | Admitting: Vascular Surgery

## 2023-06-25 ENCOUNTER — Encounter: Payer: Self-pay | Admitting: Vascular Surgery

## 2023-06-25 ENCOUNTER — Ambulatory Visit: Attending: Vascular Surgery | Admitting: Vascular Surgery

## 2023-06-25 VITALS — BP 125/83 | HR 84 | Temp 98.3°F | Resp 18 | Ht 65.0 in | Wt 148.6 lb

## 2023-06-25 DIAGNOSIS — I70208 Unspecified atherosclerosis of native arteries of extremities, other extremity: Secondary | ICD-10-CM | POA: Diagnosis present

## 2023-06-25 DIAGNOSIS — I872 Venous insufficiency (chronic) (peripheral): Secondary | ICD-10-CM

## 2023-06-25 DIAGNOSIS — M25529 Pain in unspecified elbow: Secondary | ICD-10-CM

## 2023-06-25 LAB — VAS US ABI WITH/WO TBI
Left ABI: 1.19
Right ABI: 1.17

## 2024-03-03 ENCOUNTER — Ambulatory Visit: Admitting: Gastroenterology
